# Patient Record
Sex: Male | Born: 1960 | Race: White | Hispanic: No | Marital: Married | Smoking: Never smoker
Health system: Southern US, Community
[De-identification: ages and names within clinical notes are randomized; demographics above are authoritative.]

## PROBLEM LIST (undated history)

## (undated) DIAGNOSIS — M1990 Unspecified osteoarthritis, unspecified site: Secondary | ICD-10-CM

## (undated) DIAGNOSIS — E291 Testicular hypofunction: Secondary | ICD-10-CM

## (undated) DIAGNOSIS — I10 Essential (primary) hypertension: Secondary | ICD-10-CM

## (undated) DIAGNOSIS — C20 Malignant neoplasm of rectum: Secondary | ICD-10-CM

## (undated) DIAGNOSIS — E785 Hyperlipidemia, unspecified: Secondary | ICD-10-CM

## (undated) DIAGNOSIS — R7303 Prediabetes: Secondary | ICD-10-CM

## (undated) HISTORY — DX: Essential (primary) hypertension: I10

## (undated) HISTORY — DX: Prediabetes: R73.03

## (undated) HISTORY — PX: HERNIA REPAIR: SHX51

## (undated) HISTORY — DX: Hyperlipidemia, unspecified: E78.5

## (undated) HISTORY — PX: COLOSTOMY: SHX63

## (undated) HISTORY — PX: WISDOM TOOTH EXTRACTION: SHX21

## (undated) HISTORY — PX: RECONSTRUCT / STABILIZE DISTAL ULNA: SUR1072

## (undated) HISTORY — DX: Testicular hypofunction: E29.1

## (undated) HISTORY — DX: Unspecified osteoarthritis, unspecified site: M19.90

## (undated) HISTORY — DX: Malignant neoplasm of rectum: C20

---

## 2004-10-04 ENCOUNTER — Ambulatory Visit: Payer: Self-pay | Admitting: Gastroenterology

## 2004-10-23 ENCOUNTER — Ambulatory Visit: Payer: Self-pay | Admitting: Gastroenterology

## 2007-09-02 DIAGNOSIS — C20 Malignant neoplasm of rectum: Secondary | ICD-10-CM

## 2007-09-02 HISTORY — PX: COLON SURGERY: SHX602

## 2007-09-02 HISTORY — DX: Malignant neoplasm of rectum: C20

## 2008-04-05 ENCOUNTER — Ambulatory Visit: Payer: Self-pay | Admitting: Gastroenterology

## 2008-04-15 ENCOUNTER — Encounter: Payer: Self-pay | Admitting: Gastroenterology

## 2008-04-17 ENCOUNTER — Ambulatory Visit: Payer: Self-pay | Admitting: Gastroenterology

## 2008-04-17 ENCOUNTER — Encounter: Payer: Self-pay | Admitting: Gastroenterology

## 2008-04-19 ENCOUNTER — Telehealth: Payer: Self-pay | Admitting: Gastroenterology

## 2008-04-20 ENCOUNTER — Ambulatory Visit: Payer: Self-pay | Admitting: Gastroenterology

## 2008-04-25 ENCOUNTER — Ambulatory Visit: Payer: Self-pay | Admitting: Cardiology

## 2008-04-25 ENCOUNTER — Telehealth: Payer: Self-pay | Admitting: Gastroenterology

## 2008-05-05 ENCOUNTER — Telehealth: Payer: Self-pay | Admitting: Gastroenterology

## 2008-05-09 ENCOUNTER — Encounter: Payer: Self-pay | Admitting: Gastroenterology

## 2008-05-12 ENCOUNTER — Ambulatory Visit: Payer: Self-pay | Admitting: Hematology and Oncology

## 2008-05-12 ENCOUNTER — Ambulatory Visit: Payer: Self-pay | Admitting: Gastroenterology

## 2008-05-12 ENCOUNTER — Ambulatory Visit (HOSPITAL_COMMUNITY): Admission: RE | Admit: 2008-05-12 | Discharge: 2008-05-12 | Payer: Self-pay | Admitting: Gastroenterology

## 2008-05-16 ENCOUNTER — Encounter: Payer: Self-pay | Admitting: Gastroenterology

## 2008-05-17 ENCOUNTER — Ambulatory Visit: Admission: RE | Admit: 2008-05-17 | Discharge: 2008-05-26 | Payer: Self-pay | Admitting: Radiation Oncology

## 2008-05-25 ENCOUNTER — Encounter: Payer: Self-pay | Admitting: Gastroenterology

## 2008-05-29 ENCOUNTER — Encounter: Payer: Self-pay | Admitting: Gastroenterology

## 2008-07-18 ENCOUNTER — Encounter: Payer: Self-pay | Admitting: Gastroenterology

## 2008-07-18 HISTORY — PX: ABDOMINOPERINEAL PROCTOCOLECTOMY: SUR8

## 2008-07-24 ENCOUNTER — Encounter: Payer: Self-pay | Admitting: Gastroenterology

## 2008-08-02 ENCOUNTER — Encounter: Payer: Self-pay | Admitting: Gastroenterology

## 2008-08-04 ENCOUNTER — Encounter: Payer: Self-pay | Admitting: Gastroenterology

## 2008-08-08 ENCOUNTER — Encounter: Payer: Self-pay | Admitting: Gastroenterology

## 2008-08-11 ENCOUNTER — Encounter: Payer: Self-pay | Admitting: Gastroenterology

## 2008-08-15 ENCOUNTER — Encounter: Payer: Self-pay | Admitting: Gastroenterology

## 2008-08-18 ENCOUNTER — Encounter: Payer: Self-pay | Admitting: Gastroenterology

## 2008-08-21 ENCOUNTER — Encounter: Payer: Self-pay | Admitting: Gastroenterology

## 2008-08-24 ENCOUNTER — Encounter: Payer: Self-pay | Admitting: Gastroenterology

## 2008-08-28 ENCOUNTER — Encounter: Payer: Self-pay | Admitting: Gastroenterology

## 2008-08-31 ENCOUNTER — Encounter: Payer: Self-pay | Admitting: Gastroenterology

## 2008-09-01 HISTORY — PX: PARASTOMAL HERNIA REPAIR: SHX2162

## 2008-09-12 ENCOUNTER — Encounter: Payer: Self-pay | Admitting: Gastroenterology

## 2008-09-28 ENCOUNTER — Encounter: Payer: Self-pay | Admitting: Gastroenterology

## 2008-12-28 IMAGING — CT CT ABDOMEN W/ CM
2 of 5 series · 17 of 46 positions shown, 19 images · IV contrast (Omnipaque 300)
Comparison: None

CT ABDOMEN

CLINICAL DATA: Rectal mass on colonoscopy.

CT ABDOMEN AND PELVIS WITH CONTRAST
TECHNIQUE: Multidetector CT imaging of the abdomen and pelvis was
performed using the standard protocol following bolus
administration of intravenous contrast.
Contrast: 125 ml Omniscan 300 IV contrast

[Series 2: abd_pel 5.0 b30f st · axial · 0.85mm/px · z∈[-576,-130]mm · 14 of 101 slices shown, 16 images]
[im 6/101  soft-tissue]
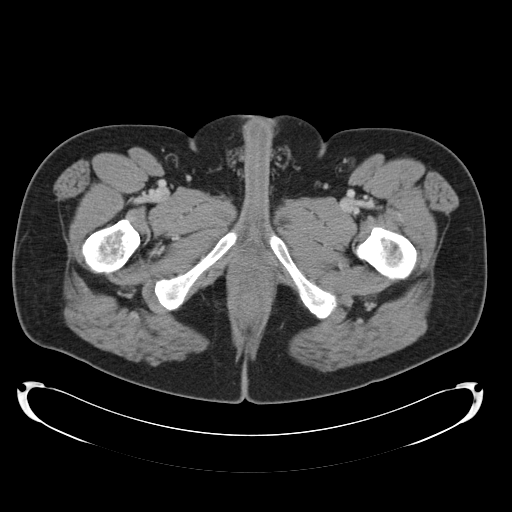
[im 6/101  bone]
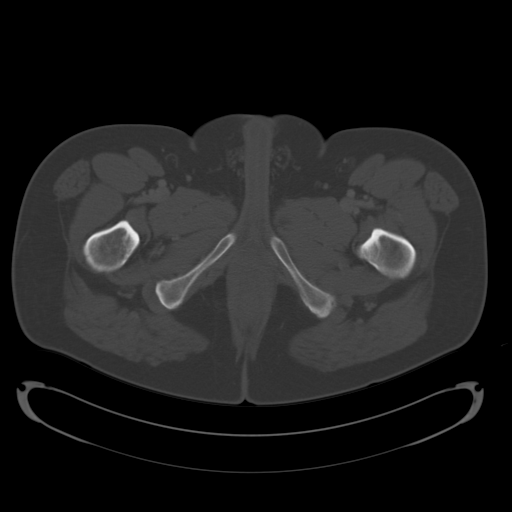
[im 11/101  soft-tissue]
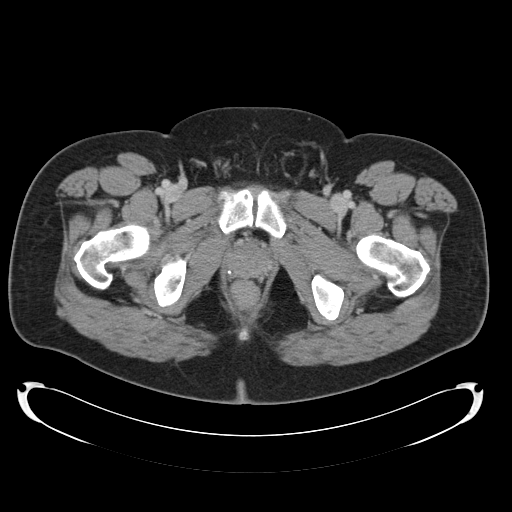
[im 22/101  soft-tissue]
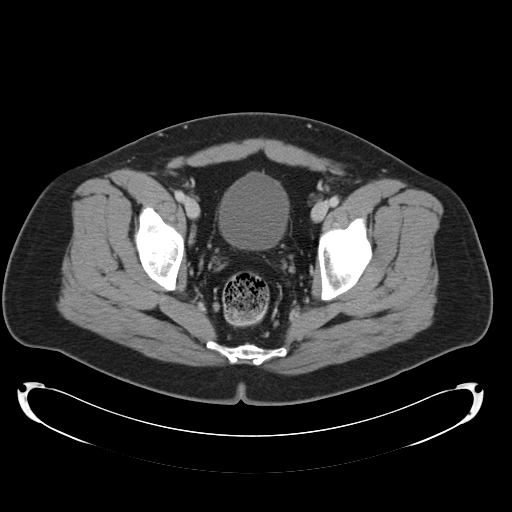
[im 27/101  soft-tissue]
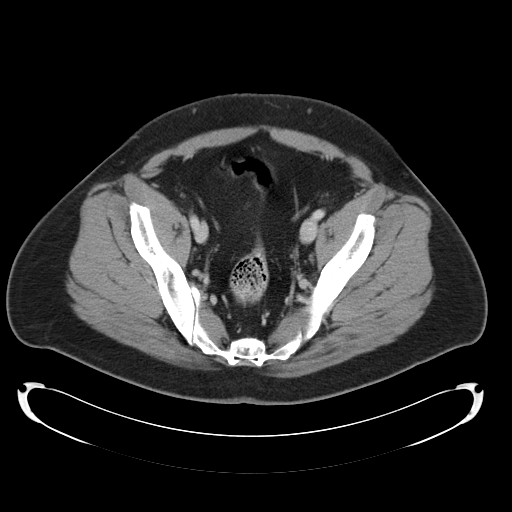
[im 32/101  soft-tissue]
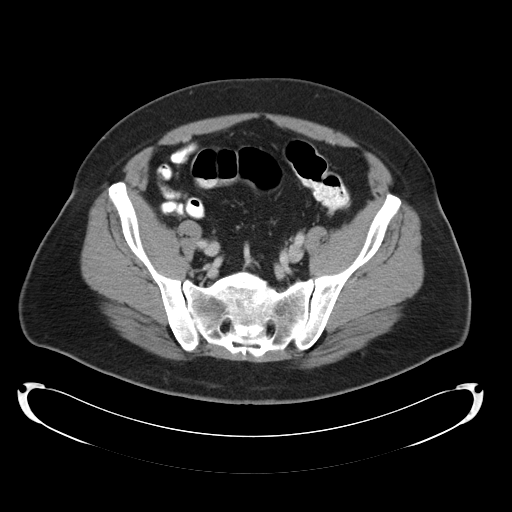
[im 43/101  soft-tissue]
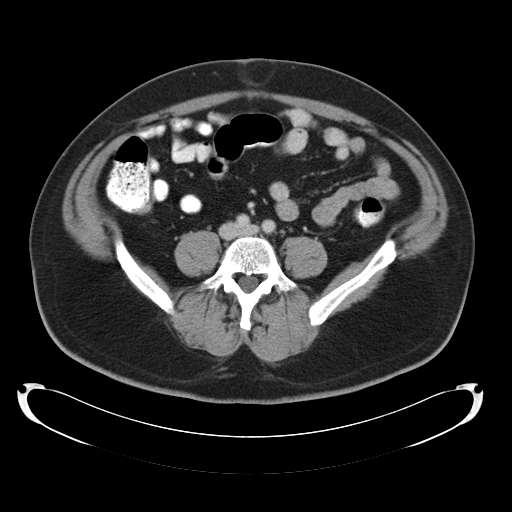
[im 48/101  soft-tissue]
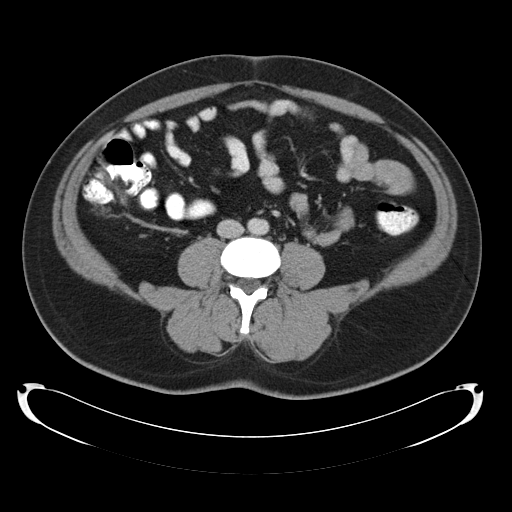
[im 53/101  soft-tissue]
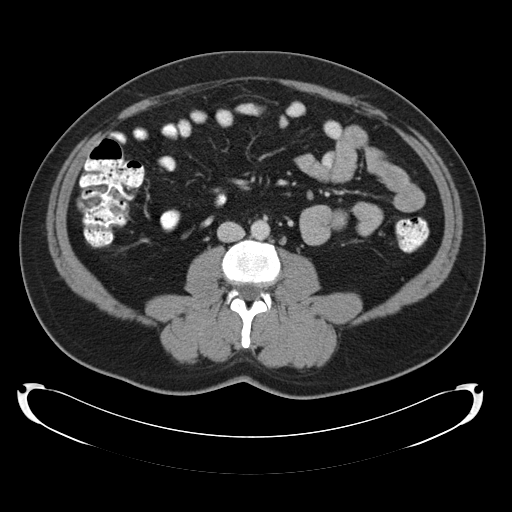
[im 58/101  soft-tissue]
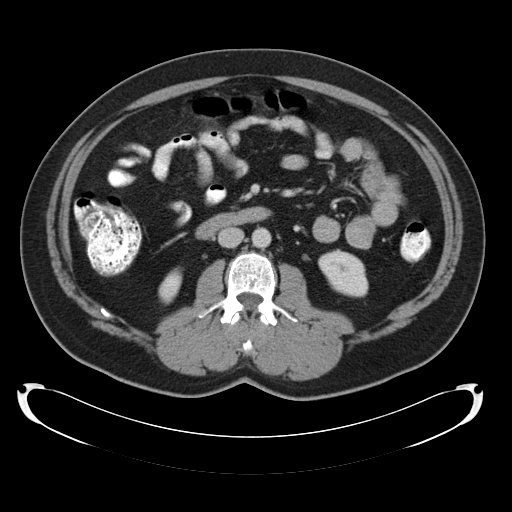
[im 58/101  bone]
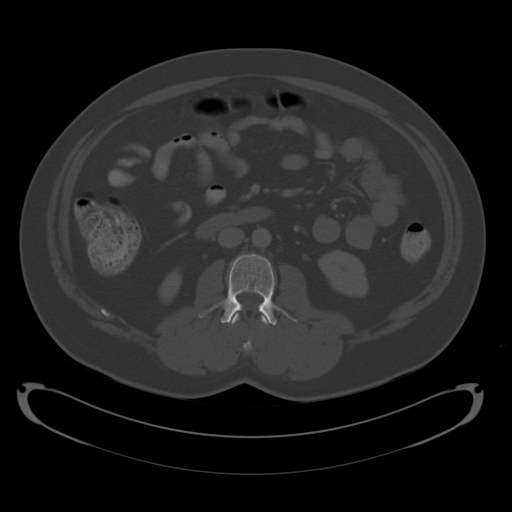
[im 69/101  soft-tissue]
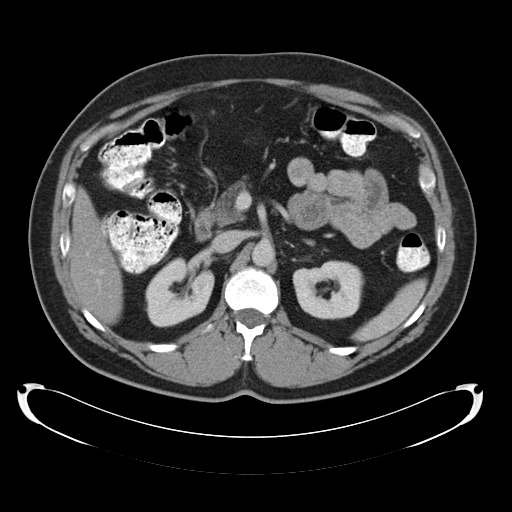
[im 74/101  soft-tissue]
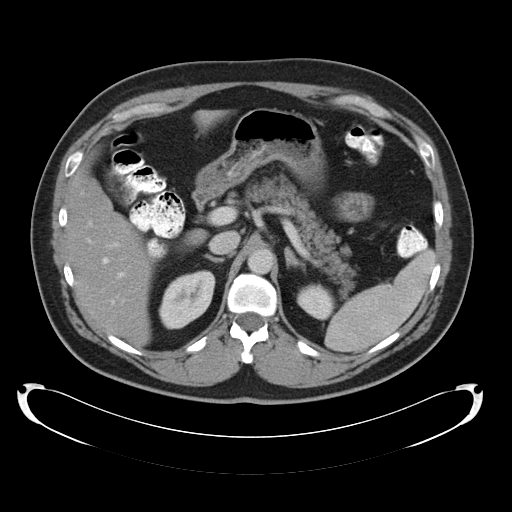
[im 79/101  soft-tissue]
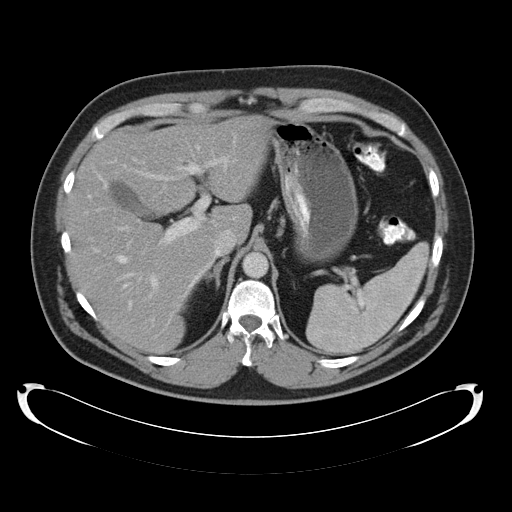
[im 90/101  soft-tissue]
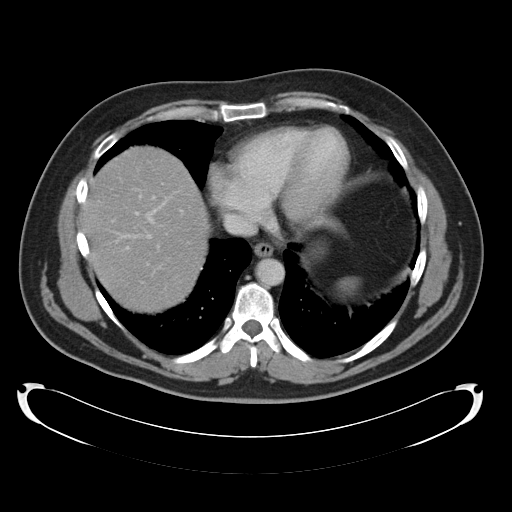
[im 95/101  soft-tissue]
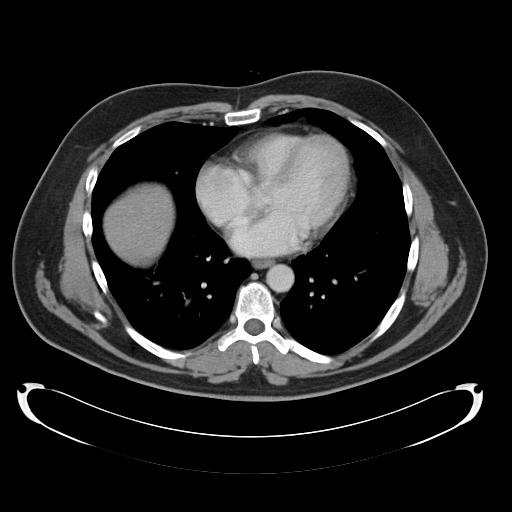

[Series 602: <mpr thick range> · coronal · 1.02mm/px · 3 of 99 slices shown]
[im 33/99  soft-tissue]
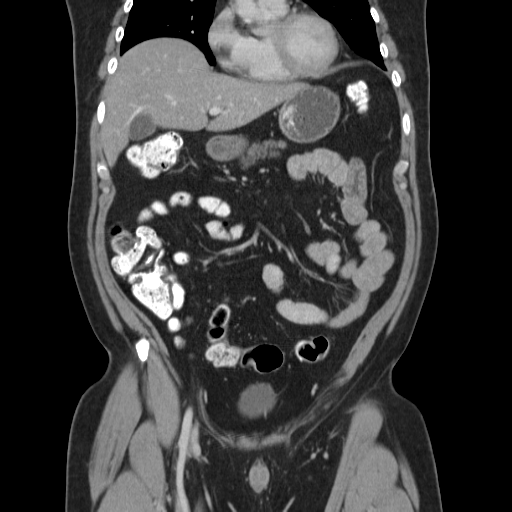
[im 44/99  soft-tissue]
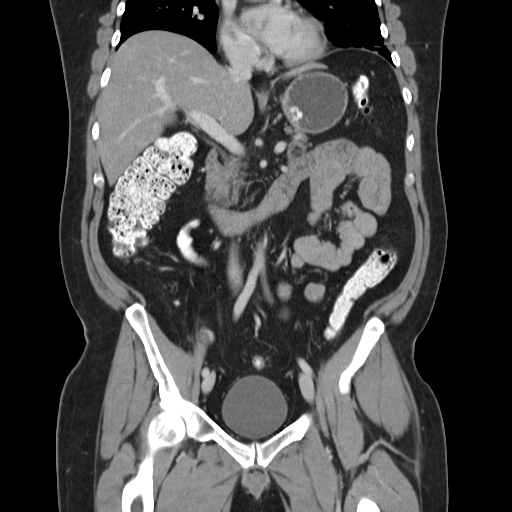
[im 55/99  soft-tissue]
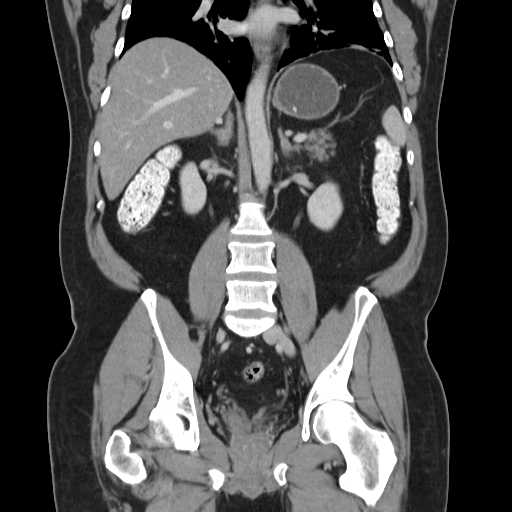

[17 of 46 positions shown; findings below may reference images not displayed]

FINDINGS: Plate-like bilateral lower lobe atelectasis or scarring
noted.  Fatty liver.  Abdominal viscera are otherwise unremarkable.
No lymphadenopathy or ascites.
IMPRESSION: No acute intra-abdominal finding.  No evidence for intra abdominal
metastatic disease.

CT PELVIS
FINDINGS: Fat containing umbilical hernia noted.  Appendix and
small bowel unremarkable.  There is focal concentric rectal wall
thickening measuring approximately 1 cm on image 85.  This segment
is not distended by oral contrast and it is difficult to
distinguish the colonic wall from colonic contents to obtain
precise thickness measurements.  The perirectal fat is clean and no
pelvic lymphadenopathy is seen.  No retroperitoneal
lymphadenopathy.  No acute bony finding or lytic/sclerotic osseous
lesion.
IMPRESSION: Focal rectal wall thickening which may correspond to the reported
neoplasm.  No evidence for intrapelvic metastasis.

## 2009-01-25 ENCOUNTER — Encounter: Payer: Self-pay | Admitting: Internal Medicine

## 2009-01-25 ENCOUNTER — Encounter: Payer: Self-pay | Admitting: Gastroenterology

## 2009-08-16 ENCOUNTER — Encounter: Payer: Self-pay | Admitting: Gastroenterology

## 2009-08-20 ENCOUNTER — Encounter: Payer: Self-pay | Admitting: Gastroenterology

## 2009-08-23 ENCOUNTER — Encounter: Payer: Self-pay | Admitting: Gastroenterology

## 2009-10-04 ENCOUNTER — Encounter: Payer: Self-pay | Admitting: Gastroenterology

## 2010-03-20 ENCOUNTER — Encounter (INDEPENDENT_AMBULATORY_CARE_PROVIDER_SITE_OTHER): Payer: Self-pay | Admitting: *Deleted

## 2010-07-10 DIAGNOSIS — I10 Essential (primary) hypertension: Secondary | ICD-10-CM | POA: Insufficient documentation

## 2010-07-10 DIAGNOSIS — E669 Obesity, unspecified: Secondary | ICD-10-CM | POA: Insufficient documentation

## 2010-07-10 DIAGNOSIS — K429 Umbilical hernia without obstruction or gangrene: Secondary | ICD-10-CM | POA: Insufficient documentation

## 2010-07-10 DIAGNOSIS — M109 Gout, unspecified: Secondary | ICD-10-CM | POA: Insufficient documentation

## 2010-07-12 ENCOUNTER — Encounter (INDEPENDENT_AMBULATORY_CARE_PROVIDER_SITE_OTHER): Payer: Self-pay | Admitting: *Deleted

## 2010-07-12 ENCOUNTER — Ambulatory Visit: Payer: Self-pay | Admitting: Gastroenterology

## 2010-07-18 ENCOUNTER — Ambulatory Visit: Payer: Self-pay | Admitting: Hematology and Oncology

## 2010-07-29 ENCOUNTER — Encounter: Payer: Self-pay | Admitting: Gastroenterology

## 2010-07-30 ENCOUNTER — Telehealth (INDEPENDENT_AMBULATORY_CARE_PROVIDER_SITE_OTHER): Payer: Self-pay | Admitting: *Deleted

## 2010-09-05 ENCOUNTER — Ambulatory Visit: Payer: Self-pay | Admitting: Hematology and Oncology

## 2010-09-16 ENCOUNTER — Ambulatory Visit
Admission: RE | Admit: 2010-09-16 | Discharge: 2010-09-16 | Payer: Self-pay | Source: Home / Self Care | Attending: Gastroenterology | Admitting: Gastroenterology

## 2010-09-16 ENCOUNTER — Encounter: Payer: Self-pay | Admitting: Gastroenterology

## 2010-10-01 NOTE — Letter (Signed)
Summary: Colonoscopy-Changed to Office Visit Letter  [facility]  [address] [place], [place] [address]   Phone: [phone]  Fax: [phone]      March 20, 2010 MRN: [id number]   Juan Higgins [address] [place], [place]  [address]   Dear Mr. Lehigh,   According to our records, it is time for you to schedule a Colonoscopy. However, after reviewing your medical record, I feel that an office visit would be most appropriate to more completely evaluate you and determine your need for a repeat procedure.  Please call [phone] (option #2) at your convenience to schedule an office visit. If you have any questions, concerns, or feel that this letter is in error, we would appreciate your call.   Sincerely,    Vania Rea. Jarold Motto, M.D.  [facility] [phone]

## 2010-10-01 NOTE — Letter (Signed)
Summary: [facility]  [facility]   Imported By: Lennie Odor 10/08/2009 14:24:22  _____________________________________________________________________  External Attachment:    Type:   Image     Comment:   External Document

## 2010-10-01 NOTE — Op Note (Signed)
Summary: [facility]  [facility]   Imported By: Lester [place] 07/22/2010 10:47:57  _____________________________________________________________________  External Attachment:    Type:   Image     Comment:   External Document

## 2010-10-01 NOTE — Letter (Signed)
Summary: [facility]  [facility]   Imported By: Lester [place] 07/22/2010 10:37:44  _____________________________________________________________________  External Attachment:    Type:   Image     Comment:   External Document

## 2010-10-01 NOTE — Op Note (Signed)
Summary: [facility]  [facility]   Imported By: Lester [place] 07/22/2010 10:43:46  _____________________________________________________________________  External Attachment:    Type:   Image     Comment:   External Document

## 2010-10-01 NOTE — Assessment & Plan Note (Signed)
Summary: ov before col...as.   History of Present Illness Visit Type: Follow-up Visit Primary GI MD: Sheryn Bison MD FACP FAGA Primary Shuntavia Yerby: Lucky Cowboy, MD Requesting Elsey Holts: NA Chief Complaint: Hx of colon cancer, patient had colostomy 2 years ago, sugeon requested recheck History of Present Illness:   50 year old white male status post abdominal perineal resection at [facility] in 2009 per localized adenocarcinoma of the rectosigmoid area. He had previous endoscopic removal of a large villous adenoma in 2006. 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 He currently is asymptomatic and is irrigating his colostomy daily. He has some vague occasional lower abdominal discomfort but otherwise denies gastrointestinal or general medical symptomatology. His previous surgeries by Dr. Durenda Hurt.   GI Review of Systems      Denies abdominal pain, acid reflux, belching, bloating, chest pain, dysphagia with liquids, dysphagia with solids, heartburn, loss of appetite, nausea, vomiting, vomiting blood, weight loss, and  weight gain.        Denies anal fissure, black tarry stools, change in bowel habit, constipation, diarrhea, diverticulosis, fecal incontinence, heme positive stool, hemorrhoids, irritable bowel syndrome, jaundice, light color stool, liver problems, rectal bleeding, and  rectal pain.    Current Medications (verified): 1)  None  Allergies (verified): No Known Drug Allergies  Past History:  Past medical, surgical, family and social histories (including risk factors) reviewed for relevance to current acute and chronic problems.  Past Medical History: Reviewed history from 07/10/2010 and no changes required. 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 No FH of Colon Cancer: Family History of Diabetes: paternal grandmother Family History of Heart Disease: mother  Lymphoma-sister  Social History: Reviewed history from 04/20/2008 and no changes required. Patient has never smoked.  Alcohol Use - no Daily Caffeine Use Occupation: Sales  Review of Systems  The patient denies allergy/sinus, anemia, anxiety-new, arthritis/joint pain, back pain, blood in urine, breast changes/lumps, change in vision, confusion, cough, coughing up blood, depression-new, fainting, fatigue, fever, headaches-new, hearing problems, heart murmur, heart rhythm changes, itching, menstrual pain, muscle pains/cramps, night sweats, nosebleeds, pregnancy symptoms, shortness of breath, skin rash, sleeping problems, sore throat, swelling of feet/legs, swollen lymph glands, thirst - excessive , urination - excessive , urination changes/pain, urine leakage, vision changes, and voice change.    Vital Signs:  Patient profile:   50 year old male Height:      71 inches Weight:      214.13 pounds BMI:     29.97 Pulse rate:   100 / minute Pulse rhythm:   regular BP sitting:   120 / 78  (left arm) Cuff size:   regular  Vitals Entered By: June McMurray CMA Duncan Dull) (July 12, 2010 8:52 AM)  Physical Exam  General:  Well developed, well nourished, no acute distress.healthy appearing.   Head:  Normocephalic and atraumatic. Eyes:  PERRLA, no icterus.exam deferred to patient's ophthalmologist.   Lungs:  Clear throughout to auscultation. Heart:  Regular rate and rhythm; no murmurs, rubs,  or bruits. Abdomen:  well-healed colostomy in left mid abdominal area. Abdominal exam is otherwise unremarkable. Bowel sounds are normal. Genitalia:  GU exam deferred to patient's Urologist.   Msk:  Symmetrical  with no gross deformities. Normal posture.  Extremities:  No clubbing, cyanosis, edema or deformities noted. Neurologic:  Alert and  oriented x4;  grossly normal neurologically. Psych:  Alert and cooperative. Normal mood and affect.   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 I also have referred him to our geneticist for evaluation of right irritable colon cancer syndromes. He does have 3 male children. Previous rectal carcinoma was T2 N0 lesion.  Orders: Colonoscopy (Colon)  Problem # 2:  HYPERTENSION (ICD-401.9) Assessment: Improved blood pressure today 120/76.He has appointment to see Dr. Oneta Rack  next week and we will request a copy of his yearly blood work for our records.  Other Orders: [facility])  Patient Instructions: 1)  Copy sent to : Dr. Lucky Cowboy 2)  Please continue current medications.  3)  Colonoscopy and Flexible Sigmoidoscopy brochure given.  4)  [facility] Patient Information Guide given to patient.  5)  Your procedure has been scheduled for 08/02/2010, please follow the seperate instructions.  6)  Your prescription(s) have been sent to you pharmacy.  7)  We will request your labs from Dr. Oneta Rack. 8)  We are referring you to the [facility], either our office or their office will contact you about an appt.  9)  The medication list was reviewed and reconciled.  All changed / newly prescribed medications were explained.  A complete medication list was provided to the patient / caregiver. Prescriptions: MOVIPREP 100 GM  SOLR (PEG-KCL-NACL-NASULF-NA ASC-C) As per prep instructions.  #1 x 0   Entered by:   Harlow Mares CMA (AAMA)   Authorized by:   Mardella Layman MD [facility]   Signed by:   Harlow Mares CMA (AAMA) on 07/12/2010   Method used:   Electronically to        [facility] [address] (retail)       [address]       [place], [place]  [address]       Ph: [phone]       Fax: [phone]   RxID:   [id number]

## 2010-10-01 NOTE — Op Note (Signed)
Summary: [facility]  Rect[facility]   Imported By: Lester [place] 07/22/2010 10:26:46  _____________________________________________________________________  External Attachment:    Type:   Image     Comment:   External Document

## 2010-10-01 NOTE — Op Note (Signed)
Summary: [facility]  [facility]   Imported By: Lester [place] 07/22/2010 10:41:43  _____________________________________________________________________  External Attachment:    Type:   Image     Comment:   External Document

## 2010-10-01 NOTE — Op Note (Signed)
Summary: Wound Drainage/[facility]   Wound Drainage/[facility]   Imported By: Lester [place] 07/22/2010 10:52:54  _____________________________________________________________________  External Attachment:    Type:   Image     Comment:   External Document

## 2010-10-01 NOTE — Op Note (Signed)
Summary: [facility]  [facility]   Imported By: Lester [place] 07/22/2010 10:49:09  _____________________________________________________________________  External Attachment:    Type:   Image     Comment:   External Document

## 2010-10-01 NOTE — Progress Notes (Signed)
  Phone Note Other Incoming   Request: Send information Summary of Call: Records received from [facility] (5 Pages) forwarded to Dr. Sheryn Bison.

## 2010-10-01 NOTE — Op Note (Signed)
Summary: [facility]  [facility]   Imported By: Lester [place] 07/22/2010 10:42:44  _____________________________________________________________________  External Attachment:    Type:   Image     Comment:   External Document

## 2010-10-01 NOTE — Letter (Signed)
Summary: [facility] Instructions  [facility]  [address] [place], [place] [address]   Phone: [phone]  Fax: [phone]       GAVIN TELFORD    13-Apr-1961    MRN: [id number]        Procedure Day /Date: 08/02/2010 Friday     Arrival Time: 2:00pm     Procedure Time: 3:00pm     Location of Procedure:                    X  [facility] (4th Floor)                        PREPARATION FOR COLONOSCOPY WITH MOVIPREP   Starting 5 days prior to your procedure 07/28/2010 do not eat nuts, seeds, popcorn, corn, beans, peas,  salads, or any raw vegetables.  Do not take any fiber supplements (e.g. Metamucil, Citrucel, and Benefiber).  THE DAY BEFORE YOUR PROCEDURE         DATE: 08/01/2010   DAY: Thursday  1.  Drink clear liquids the entire day-NO SOLID FOOD  2.  Do not drink anything colored red or purple.  Avoid juices with pulp.  No orange juice.  3.  Drink at least 64 oz. (8 glasses) of fluid/clear liquids during the day to prevent dehydration and help the prep work efficiently.  CLEAR LIQUIDS INCLUDE: Water Jello Ice Popsicles Tea (sugar ok, no milk/cream) Powdered fruit flavored drinks Coffee (sugar ok, no milk/cream) Gatorade Juice: apple, white grape, white cranberry  Lemonade Clear bullion, consomm, broth Carbonated beverages (any kind) Strained chicken noodle soup Hard Candy                             4.  In the morning, mix first dose of MoviPrep solution:    Empty 1 Pouch A and 1 Pouch B into the disposable container    Add lukewarm drinking water to the top line of the container. Mix to dissolve    Refrigerate (mixed solution should be used within 24 hrs)  5.  Begin drinking the prep at 5:00 p.m. The MoviPrep container is divided by 4 marks.   Every 15 minutes drink the solution down to the next mark (approximately 8 oz) until the full liter is complete.   6.  Follow completed prep with 16 oz of clear liquid of your choice (Nothing  red or purple).  Continue to drink clear liquids until bedtime.  7.  Before going to bed, mix second dose of MoviPrep solution:    Empty 1 Pouch A and 1 Pouch B into the disposable container    Add lukewarm drinking water to the top line of the container. Mix to dissolve    Refrigerate  THE DAY OF YOUR PROCEDURE      DATE: 08/02/2010   DAY: Friday  Beginning at 10:00am (5 hours before procedure):         1. Every 15 minutes, drink the solution down to the next mark (approx 8 oz) until the full liter is complete.  2. Follow completed prep with 16 oz. of clear liquid of your choice.    3. You may drink clear liquids until 1:00pm (2 HOURS BEFORE PROCEDURE).   MEDICATION INSTRUCTIONS  Unless otherwise instructed, you should take regular prescription medications with a small sip of water   as early as possible the morning of your procedure.  OTHER INSTRUCTIONS  You will need a responsible adult at least 52 years of age to accompany you and drive you home.   This person must remain in the waiting room during your procedure.  Wear loose fitting clothing that is easily removed.  Leave jewelry and other valuables at home.  However, you may wish to bring a book to read or  an iPod/MP3 player to listen to music as you wait for your procedure to start.  Remove all body piercing jewelry and leave at home.  Total time from sign-in until discharge is approximately 2-3 hours.  You should go home directly after your procedure and rest.  You can resume normal activities the  day after your procedure.  The day of your procedure you should not:   Drive   Make legal decisions   Operate machinery   Drink alcohol   Return to work  You will receive specific instructions about eating, activities and medications before you leave.    The above instructions have been reviewed and explained to me by   _______________________    I fully understand and can verbalize these  instructions _____________________________ Date _________

## 2010-10-01 NOTE — Op Note (Signed)
Summary: [facility]  Wound washing/[facility]   Imported By: Lester [place] 07/22/2010 10:51:08  _____________________________________________________________________  External Attachment:    Type:   Image     Comment:   External Document

## 2010-10-01 NOTE — Op Note (Signed)
Summary: [facility]  [facility]   Imported By: Lester [place] 07/22/2010 10:45:32  _____________________________________________________________________  External Attachment:    Type:   Image     Comment:   External Document

## 2010-10-01 NOTE — Letter (Signed)
Summary: Oncology Surgical/[facility]  Oncology Surgical/[facility]   Imported By: Lester [place] 07/22/2010 10:40:04  _____________________________________________________________________  External Attachment:    Type:   Image     Comment:   External Document

## 2010-10-01 NOTE — Op Note (Signed)
Summary: [facility]  [facility]   Imported By: Lester [place] 07/22/2010 10:28:08  _____________________________________________________________________  External Attachment:    Type:   Image     Comment:   External Document

## 2010-10-01 NOTE — Op Note (Signed)
Summary: Parastomal hernia/[facility]  Parastomal hernia/[facility]   Imported By: Lester [place] 07/22/2010 10:35:52  _____________________________________________________________________  External Attachment:    Type:   Image     Comment:   External Document

## 2010-10-01 NOTE — Op Note (Signed)
Summary: [facility]  [facility]   Imported By: Lester [place] 07/22/2010 10:46:56  _____________________________________________________________________  External Attachment:    Type:   Image     Comment:   External Document

## 2010-10-03 NOTE — Procedures (Signed)
Summary: Colonoscopy  Patient: Juan Higgins Note: All result statuses are Final unless otherwise noted.  Tests: (1) Colonoscopy (COL)   COL Colonoscopy           DONE     [facility]     [address]     [place], [place]  [address]           COLONOSCOPY PROCEDURE REPORT           PATIENT:  [name], [name]  MR#:  [id number]     BIRTHDATE:  Mar 01, 1961, 49 yrs. old  GENDER:  male     ENDOSCOPIST:  Vania Rea. Jarold Motto, MD, [facility]     REF. BY:  Durenda Hurt, M.D.     Lucky Cowboy, M.D.     PROCEDURE DATE:  09/16/2010     PROCEDURE:  Diagnostic Colonoscopy     ASA CLASS:  Class II     INDICATIONS:  APESECTION FOR T2N0M0 RECTAL CA IN 2009.RECENT     PARASTOMAL HERNIA REVISION.     MEDICATIONS:   Fentanyl 75 mcg IV, Versed 8 mg IV           DESCRIPTION OF PROCEDURE:   After the risks benefits and     alternatives of the procedure were thoroughly explained, informed     consent was obtained.  STOMAL EXAM NORMAL. The LB 180AL [id number]     endoscope was introduced through the anus and advanced to the     cecum, which was identified by both the appendix and ileocecal     valve, without limitations.  The quality of the prep was     excellent, using MoviPrep.  The instrument was then slowly     withdrawn as the colon was fully examined.     <<PROCEDUREIMAGES>>           FINDINGS:  No polyps or cancers were seen.  This was otherwise a     normal examination of the colon. INTUBATION OF COLOSTOMY     SITE.RECTUM SURGICALLY ABSENT,,,   Retroflexion was not performed.     The scope was then withdrawn from the patient and the procedure     completed.           COMPLICATIONS:  None     ENDOSCOPIC IMPRESSION:     1) No polyps or cancers     2) Otherwise normal examination     PRIOR AP RESECTION FOR RECTAL CARCINIMA.     RECOMMENDATIONS:     1) Repeat Colonoscopy in 3 years.     REPEAT EXAM:  No           ______________________________     Vania Rea. Jarold Motto, MD, [facility]        CC:  Durenda Hurt, M.D.     Lucky Cowboy, M.D.           n.     eSIGNED:   Vania Rea. Emanuela Runnion at 09/16/2010 10:51 AM           Geryl Councilman, [phone]  Note: An exclamation mark (!) indicates a result that was not dispersed into the flowsheet. Document Creation Date: 09/16/2010 10:52 AM _______________________________________________________________________  (1) Order result status: Final Collection or observation date-time: 09/16/2010 10:44 Requested date-time:  Receipt date-time:  Reported date-time:  Referring Physician:   Ordering Physician: Sheryn Bison [phone]) Specimen Source:  Source: Launa Grill Order Number: [id number] Lab site:   Appended Document: Colonoscopy    Clinical Lists Changes  Observations:  Added new observation of COLONNXTDUE: 09/2013 (09/16/2010 13:05)

## 2010-10-10 ENCOUNTER — Encounter: Payer: Self-pay | Admitting: Gastroenterology

## 2010-10-23 NOTE — Letter (Signed)
Summary: [facility]  [facility]   Imported By: Lennie Odor 10/17/2010 13:54:46  _____________________________________________________________________  External Attachment:    Type:   Image     Comment:   External Document

## 2011-09-02 HISTORY — PX: OTHER SURGICAL HISTORY: SHX169

## 2013-07-25 ENCOUNTER — Other Ambulatory Visit: Payer: Self-pay | Admitting: Physician Assistant

## 2013-07-25 MED ORDER — TESTOSTERONE 40.5 MG/2.5GM (1.62%) TD GEL: TRANSDERMAL | Status: DC

## 2013-09-07 ENCOUNTER — Encounter: Payer: Self-pay | Admitting: Internal Medicine

## 2013-09-09 ENCOUNTER — Encounter: Payer: Self-pay | Admitting: Internal Medicine

## 2013-09-09 ENCOUNTER — Other Ambulatory Visit: Payer: Self-pay | Admitting: Internal Medicine

## 2013-09-09 ENCOUNTER — Ambulatory Visit (INDEPENDENT_AMBULATORY_CARE_PROVIDER_SITE_OTHER): Payer: 59 | Admitting: Internal Medicine

## 2013-09-09 VITALS — BP 124/88 | HR 76 | Temp 98.1°F | Resp 18 | Ht 68.25 in | Wt 220.2 lb

## 2013-09-09 DIAGNOSIS — Z113 Encounter for screening for infections with a predominantly sexual mode of transmission: Secondary | ICD-10-CM

## 2013-09-09 DIAGNOSIS — R740 Nonspecific elevation of levels of transaminase and lactic acid dehydrogenase [LDH]: Secondary | ICD-10-CM

## 2013-09-09 DIAGNOSIS — Z1212 Encounter for screening for malignant neoplasm of rectum: Secondary | ICD-10-CM

## 2013-09-09 DIAGNOSIS — R7401 Elevation of levels of liver transaminase levels: Secondary | ICD-10-CM

## 2013-09-09 DIAGNOSIS — Z0000 Encounter for general adult medical examination without abnormal findings: Secondary | ICD-10-CM

## 2013-09-09 DIAGNOSIS — R7402 Elevation of levels of lactic acid dehydrogenase (LDH): Secondary | ICD-10-CM

## 2013-09-09 DIAGNOSIS — Z125 Encounter for screening for malignant neoplasm of prostate: Secondary | ICD-10-CM

## 2013-09-09 DIAGNOSIS — Z111 Encounter for screening for respiratory tuberculosis: Secondary | ICD-10-CM

## 2013-09-09 DIAGNOSIS — E291 Testicular hypofunction: Secondary | ICD-10-CM

## 2013-09-09 DIAGNOSIS — I10 Essential (primary) hypertension: Secondary | ICD-10-CM

## 2013-09-09 DIAGNOSIS — E559 Vitamin D deficiency, unspecified: Secondary | ICD-10-CM

## 2013-09-09 DIAGNOSIS — E349 Endocrine disorder, unspecified: Secondary | ICD-10-CM | POA: Insufficient documentation

## 2013-09-09 LAB — CBC WITH DIFFERENTIAL/PLATELET
Basophils Absolute: 0 10*3/uL (ref 0.0–0.1)
Basophils Relative: 0 % (ref 0–1)
EOS ABS: 0.1 10*3/uL (ref 0.0–0.7)
Eosinophils Relative: 2 % (ref 0–5)
HCT: 44.9 % (ref 39.0–52.0)
HEMOGLOBIN: 15.8 g/dL (ref 13.0–17.0)
LYMPHS PCT: 22 % (ref 12–46)
Lymphs Abs: 1.6 10*3/uL (ref 0.7–4.0)
MCH: 28.7 pg (ref 26.0–34.0)
MCHC: 35.2 g/dL (ref 30.0–36.0)
MCV: 81.5 fL (ref 78.0–100.0)
MONOS PCT: 6 % (ref 3–12)
Monocytes Absolute: 0.5 10*3/uL (ref 0.1–1.0)
NEUTROS PCT: 70 % (ref 43–77)
Neutro Abs: 5 10*3/uL (ref 1.7–7.7)
Platelets: 215 10*3/uL (ref 150–400)
RBC: 5.51 MIL/uL (ref 4.22–5.81)
RDW: 14.6 % (ref 11.5–15.5)
WBC: 7.2 10*3/uL (ref 4.0–10.5)

## 2013-09-09 LAB — HEMOGLOBIN A1C
Hgb A1c MFr Bld: 5.9 % — ABNORMAL HIGH (ref <5.7)
MEAN PLASMA GLUCOSE: 123 mg/dL — AB (ref <117)

## 2013-09-09 NOTE — Progress Notes (Signed)
Patient ID: Juan Higgins, male   DOB: Mar 15, 1961, 53 y.o.   MRN: [id number]  Annual Screening Comprehensive Examination  This very nice 53 y.o.  MWM presents for complete physical.  Patient has been followed for HTN, Diabetes  Prediabetes, Hyperlipidemia, and Vitamin D Deficiency.   HTN predates since Nov 2012. Patient's reports BP has been controlled at home.Today's  BP: 124/88 mmHg. Patient denies any cardiac symptoms as chest pain, palpitations, shortness of breath, dizziness or ankle swelling.   Patient's hyperlipidemia is controlled with diet and medications. Patient denies myalgias or other medication SE's. Last cholesterol last visit was 122, triglycerides 206, HDL 44 and LDL 122 in Dec 2013.     Patient has prediabetes diagnosed in Nov 2012 with A1c 5.7 % and last A1c 5.5 % in Dec 2013. Patient denies reactive hypoglycemic symptoms, visual blurring, diabetic polys, or paresthesias.    Patient has history of Rectal Cancer Dx'd in Sept 2009 and underwent AP resection with colostomy creation. He has now been released after 5 year  Remission free F/U and to continue close periodic surveillance at [facility].   Finally, patient has history of Vitamin D Deficiency of 14 in 2009 with last vitamin D of 61 in Dec 2014. He also has  testosterone deficiency (testosterone 260 in 2012) for which he is on treatment with improved sense of well being and energy.     Medication Sig Dispense Refill  . aspirin 81 MG tablet Take 81 mg by mouth daily.      . Cholecalciferol (VITAMIN D-3) 1000 UNITS CAPS Take 8,000 Units by mouth daily.      . Testosterone (ANDROGEL) 40.5 MG/2.5GM (1.62%) GEL Apply 2 pumps daily  2.5 g  3     No Known Allergies  Past Medical History  Diagnosis Date  . Hyperlipidemia   . Hypertension   . Pre-diabetes   . Hypogonadism male     Past Surgical History  Procedure Laterality Date  . Hernia repair     2009 Colorectal AP Repair w/ Colostomy Creation         .  Reconstruct / stabilize distal ulna      Family History  Problem Relation Age of Onset  . COPD Mother   . Alcohol abuse Father   . Cirrhosis Father   . COPD Sister   . Alcohol abuse Brother     History   Social History  . Marital Status: Married    Spouse Name: N/A   Occupational History  . Not on file.   Social History Main Topics  . Smoking status: Never Smoker   . Smokeless tobacco: Not on file  . Alcohol Use: Yes     Comment: occasional  . Drug Use: No  . Sexual Activity: Not on file      ROS Constitutional: Denies fever, chills, weight loss/gain, headaches, insomnia, fatigue, night sweats, and change in appetite. Eyes: Denies redness, blurred vision, diplopia, discharge, itchy, watery eyes.  ENT: Denies discharge, congestion, post nasal drip, epistaxis, sore throat, earache, hearing loss, dental pain, Tinnitus, Vertigo, Sinus pain, snoring.  Cardio: Denies chest pain, palpitations, irregular heartbeat, syncope, dyspnea, diaphoresis, orthopnea, PND, claudication, edema Respiratory: denies cough, dyspnea, DOE, pleurisy, hoarseness, laryngitis, wheezing.  Gastrointestinal: Denies dysphagia, heartburn, reflux, water brash, pain, cramps, nausea, vomiting, bloating, diarrhea, constipation, hematemesis, melena, hematochezia, jaundice, hemorrhoids Genitourinary: Denies dysuria, frequency, urgency, nocturia, hesitancy, discharge, hematuria, flank pain Musculoskeletal: Denies arthralgia, myalgia, stiffness, Jt. Swelling, pain, limp, and strain/sprain. Skin: Denies puritis, rash,  hives, warts, acne, eczema, changing in skin lesion Neuro: No weakness, tremor, incoordination, spasms, paresthesia, pain Psychiatric: Denies confusion, memory loss, sensory loss Endocrine: Denies change in weight, skin, hair change, nocturia, and paresthesia, diabetic polys, visual blurring, hyper / hypo glycemic episodes.  Heme/Lymph: No excessive bleeding, bruising, or elarged lymph nodes.  BP:  124/88  Pulse: 76  Temp: 98.1 F (36.7 C)  Resp: 18      BMI         33.22 kg/(m^2)   Height     5' 8.25"    Weight    220 lb 3.2 oz   Physical Exam General Appearance: Well nourished, in no apparent distress. Eyes: PERRLA, EOMs, conjunctiva no swelling or erythema, normal fundi and vessels. Sinuses: No frontal/maxillary tenderness ENT/Mouth: EACs patent / TMs  nl. Nares clear without erythema, swelling, mucoid exudates. Oral hygiene is good. No erythema, swelling, or exudate. Tongue normal, non-obstructing. Tonsils not swollen or erythematous. Hearing normal.  Neck: Supple, thyroid normal. No bruits, nodes or JVD. Respiratory: Respiratory effort normal.  BS equal and clear bilateral without rales, rhonci, wheezing or stridor. Cardio: Heart sounds are normal with regular rate and rhythm and no murmurs, rubs or gallops. Peripheral pulses are normal and equal bilaterally without edema. No aortic or femoral bruits. Chest: symmetric with normal excursions and percussion.  Abdomen: Flat, soft, with bowl sounds. Functioning LLL colostomy. Nontender, no guarding, rebound, hernias, masses, or organomegaly.  Lymphatics: Non tender without lymphadenopathy.  Genitourinary: Deferred post AP ressection. Musculoskeletal: Full ROM all peripheral extremities, joint stability, 5/5 strength, and normal gait. Skin: Warm and dry without rashes, lesions, cyanosis, clubbing or  ecchymosis.  Neuro: Cranial nerves intact, reflexes equal bilaterally. Normal muscle tone, no cerebellar symptoms. Sensation intact.  Pysch: Awake and oriented X 3, normal affect, insight and judgment appropriate.   Assessment and Plan  1. Annual Screening Examination 2. Hypertension  3. Hyperlipidemia 4. Pre Diabetes 5. Vitamin D Deficiency 6. Obesity 7. Hx/o Rectal Cancer(2009) 8. Testosterone Deficiency  Continue prudent diet as discussed, weight control, BP monitoring, regular exercise, and medications as discussed.   Discussed med effects and SE's. Routine screening labs and tests as requested with regular follow-up as recommended.

## 2013-09-09 NOTE — Patient Instructions (Signed)

## 2013-09-10 LAB — BASIC METABOLIC PANEL WITH GFR
BUN: 14 mg/dL (ref 6–23)
CALCIUM: 9.9 mg/dL (ref 8.4–10.5)
CO2: 28 mEq/L (ref 19–32)
Chloride: 103 mEq/L (ref 96–112)
Creat: 0.93 mg/dL (ref 0.50–1.35)
Glucose, Bld: 85 mg/dL (ref 70–99)
POTASSIUM: 4.1 meq/L (ref 3.5–5.3)
SODIUM: 140 meq/L (ref 135–145)

## 2013-09-10 LAB — LIPID PANEL
Cholesterol: 208 mg/dL — ABNORMAL HIGH (ref 0–200)
HDL: 46 mg/dL (ref >39)
LDL CALC: 125 mg/dL — AB (ref 0–99)
TRIGLYCERIDES: 183 mg/dL — AB (ref <150)
Total CHOL/HDL Ratio: 4.5 Ratio
VLDL: 37 mg/dL (ref 0–40)

## 2013-09-10 LAB — HEPATIC FUNCTION PANEL
ALT: 38 U/L (ref 0–53)
AST: 22 U/L (ref 0–37)
Albumin: 4.5 g/dL (ref 3.5–5.2)
Alkaline Phosphatase: 49 U/L (ref 39–117)
BILIRUBIN DIRECT: 0.1 mg/dL (ref 0.0–0.3)
Indirect Bilirubin: 0.7 mg/dL (ref 0.0–0.9)
TOTAL PROTEIN: 7 g/dL (ref 6.0–8.3)
Total Bilirubin: 0.8 mg/dL (ref 0.3–1.2)

## 2013-09-10 LAB — VITAMIN D 25 HYDROXY (VIT D DEFICIENCY, FRACTURES): Vit D, 25-Hydroxy: 75 ng/mL (ref 30–89)

## 2013-09-10 LAB — VITAMIN B12: Vitamin B-12: 258 pg/mL (ref 211–911)

## 2013-09-10 LAB — MAGNESIUM: Magnesium: 2.1 mg/dL (ref 1.5–2.5)

## 2013-09-10 LAB — HEPATITIS C ANTIBODY: HCV Ab: NEGATIVE

## 2013-09-10 LAB — URINALYSIS, MICROSCOPIC ONLY
Bacteria, UA: NONE SEEN
Casts: NONE SEEN
Crystals: NONE SEEN
Squamous Epithelial / LPF: NONE SEEN

## 2013-09-10 LAB — INSULIN, FASTING: Insulin fasting, serum: 37 u[IU]/mL — ABNORMAL HIGH (ref 3–28)

## 2013-09-10 LAB — MICROALBUMIN / CREATININE URINE RATIO
CREATININE, URINE: 236.9 mg/dL
MICROALB UR: 0.68 mg/dL (ref 0.00–1.89)
Microalb Creat Ratio: 2.9 mg/g (ref 0.0–30.0)

## 2013-09-10 LAB — HIV ANTIBODY (ROUTINE TESTING W REFLEX): HIV: NONREACTIVE

## 2013-09-10 LAB — TESTOSTERONE: Testosterone: 198 ng/dL — ABNORMAL LOW (ref 300–890)

## 2013-09-10 LAB — RPR

## 2013-09-10 LAB — HEPATITIS B SURFACE ANTIBODY,QUALITATIVE: HEP B S AB: NEGATIVE

## 2013-09-10 LAB — HEPATITIS B CORE ANTIBODY, TOTAL: Hep B Core Total Ab: NONREACTIVE

## 2013-09-10 LAB — PSA: PSA: 0.5 ng/mL (ref <=4.00)

## 2013-09-10 LAB — HEPATITIS A ANTIBODY, TOTAL: HEP A TOTAL AB: NONREACTIVE

## 2013-09-11 ENCOUNTER — Other Ambulatory Visit: Payer: Self-pay | Admitting: Internal Medicine

## 2013-09-11 DIAGNOSIS — E782 Mixed hyperlipidemia: Secondary | ICD-10-CM

## 2013-09-11 MED ORDER — ATORVASTATIN CALCIUM 80 MG PO TABS: ORAL_TABLET | ORAL | Status: DC

## 2013-09-12 LAB — TB SKIN TEST
INDURATION: 0 mm
TB Skin Test: NEGATIVE

## 2013-09-12 LAB — TSH: TSH: 1.164 u[IU]/mL (ref 0.350–4.500)

## 2013-09-12 LAB — HEPATITIS B E ANTIBODY: Hepatitis Be Antibody: NEGATIVE

## 2014-06-26 ENCOUNTER — Other Ambulatory Visit: Payer: Self-pay | Admitting: Physician Assistant

## 2014-07-17 ENCOUNTER — Ambulatory Visit (INDEPENDENT_AMBULATORY_CARE_PROVIDER_SITE_OTHER): Payer: 59 | Admitting: Physician Assistant

## 2014-07-17 ENCOUNTER — Encounter (INDEPENDENT_AMBULATORY_CARE_PROVIDER_SITE_OTHER): Payer: Self-pay

## 2014-07-17 ENCOUNTER — Encounter: Payer: Self-pay | Admitting: Physician Assistant

## 2014-07-17 VITALS — BP 136/92 | HR 90 | Temp 98.4°F | Resp 18 | Wt 217.0 lb

## 2014-07-17 DIAGNOSIS — E349 Endocrine disorder, unspecified: Secondary | ICD-10-CM

## 2014-07-17 DIAGNOSIS — E291 Testicular hypofunction: Secondary | ICD-10-CM

## 2014-07-17 DIAGNOSIS — J0100 Acute maxillary sinusitis, unspecified: Secondary | ICD-10-CM

## 2014-07-17 DIAGNOSIS — R7309 Other abnormal glucose: Secondary | ICD-10-CM | POA: Insufficient documentation

## 2014-07-17 DIAGNOSIS — E559 Vitamin D deficiency, unspecified: Secondary | ICD-10-CM

## 2014-07-17 DIAGNOSIS — E785 Hyperlipidemia, unspecified: Secondary | ICD-10-CM

## 2014-07-17 DIAGNOSIS — I10 Essential (primary) hypertension: Secondary | ICD-10-CM

## 2014-07-17 DIAGNOSIS — R7303 Prediabetes: Secondary | ICD-10-CM

## 2014-07-17 DIAGNOSIS — E782 Mixed hyperlipidemia: Secondary | ICD-10-CM | POA: Insufficient documentation

## 2014-07-17 MED ORDER — PREDNISONE 20 MG PO TABS: ORAL_TABLET | ORAL | Status: AC

## 2014-07-17 MED ORDER — BENZONATATE 100 MG PO CAPS: 100.0000 mg | ORAL_CAPSULE | Freq: Four times a day (QID) | ORAL | Status: DC | PRN

## 2014-07-17 NOTE — Patient Instructions (Signed)
- Allegra OTC for allergies and to help with fluid behind ear drums.   -While drinking fluids, pinch and hold nose close and swallow.  This will help open up your eustachian tubes to drain the fluid behind your ear drums. -Try steam showers to open your nasal passages.  Drink lots of water to stay hydrated and to thin mucous. - Take Tessalon Perles as prescribed for cough  -Saline nasal spray.  Take 2 sprays in each nostril at bedtime.  Make sure you spray towards the outside of each nostril towards the outer corner of your eye, hold nose close and tilt head back.    -It can take up to 2 weeks to feel better.  Sinusitis is mostly caused by viruses.  -If you do not get better in 7-10 days (Have fever, facial pain, dental pain and swelling), then please call the office and I can send in an antibiotic.  -will call you with results of blood work. -Please keep physical appt in January 2016.    Sinusitis Sinusitis is redness, soreness, and inflammation of the paranasal sinuses. Paranasal sinuses are air pockets within the bones of your face (beneath the eyes, the middle of the forehead, or above the eyes). In healthy paranasal sinuses, mucus is able to drain out, and air is able to circulate through them by way of your nose. However, when your paranasal sinuses are inflamed, mucus and air can become trapped. This can allow bacteria and other germs to grow and cause infection. Sinusitis can develop quickly and last only a short time (acute) or continue over a long period (chronic). Sinusitis that lasts for more than 12 weeks is considered chronic.  CAUSES  Causes of sinusitis include:  Allergies.  Structural abnormalities, such as displacement of the cartilage that separates your nostrils (deviated septum), which can decrease the air flow through your nose and sinuses and affect sinus drainage.  Functional abnormalities, such as when the small hairs (cilia) that line your sinuses and help remove  mucus do not work properly or are not present. SIGNS AND SYMPTOMS  Symptoms of acute and chronic sinusitis are the same. The primary symptoms are pain and pressure around the affected sinuses. Other symptoms include:  Upper toothache.  Earache.  Headache.  Bad breath.  Decreased sense of smell and taste.  A cough, which worsens when you are lying flat.  Fatigue.  Fever.  Thick drainage from your nose, which often is green and may contain pus (purulent).  Swelling and warmth over the affected sinuses. DIAGNOSIS  Your health care provider will perform a physical exam. During the exam, your health care provider may:  Look in your nose for signs of abnormal growths in your nostrils (nasal polyps).  Tap over the affected sinus to check for signs of infection.  View the inside of your sinuses (endoscopy) using an imaging device that has a light attached (endoscope). If your health care provider suspects that you have chronic sinusitis, one or more of the following tests may be recommended:  Allergy tests.  Nasal culture. A sample of mucus is taken from your nose, sent to a lab, and screened for bacteria.  Nasal cytology. A sample of mucus is taken from your nose and examined by your health care provider to determine if your sinusitis is related to an allergy. TREATMENT  Most cases of acute sinusitis are related to a viral infection and will resolve on their own within 10 days. Sometimes medicines are prescribed to help  relieve symptoms (pain medicine, decongestants, nasal steroid sprays, or saline sprays).  However, for sinusitis related to a bacterial infection, your health care provider will prescribe antibiotic medicines. These are medicines that will help kill the bacteria causing the infection.  Rarely, sinusitis is caused by a fungal infection. In theses cases, your health care provider will prescribe antifungal medicine. For some cases of chronic sinusitis, surgery is  needed. Generally, these are cases in which sinusitis recurs more than 3 times per year, despite other treatments. HOME CARE INSTRUCTIONS   Drink plenty of water. Water helps thin the mucus so your sinuses can drain more easily.  Use a humidifier.  Inhale steam 3 to 4 times a day (for example, sit in the bathroom with the shower running).  Apply a warm, moist washcloth to your face 3 to 4 times a day, or as directed by your health care provider.  Use saline nasal sprays to help moisten and clean your sinuses.  Take medicines only as directed by your health care provider.  If you were prescribed either an antibiotic or antifungal medicine, finish it all even if you start to feel better. SEEK IMMEDIATE MEDICAL CARE IF:  You have increasing pain or severe headaches.  You have nausea, vomiting, or drowsiness.  You have swelling around your face.  You have vision problems.  You have a stiff neck.  You have difficulty breathing. MAKE SURE YOU:   Understand these instructions.  Will watch your condition.  Will get help right away if you are not doing well or get worse. Document Released: 08/18/2005 Document Revised: 01/02/2014 Document Reviewed: 09/02/2011 [facility] Patient Information 2015 [place], [place]. This information is not intended to replace advice given to you by your health care provider. Make sure you discuss any questions you have with your health care provider.  GIVE PT FOOD CHOICE LISTS FOR MEAL PLANNING  1)The amount of food you eat is important  -Too much can increase glucose levels and cause you to gain weight (which can  also increase glucose levels.  -Too little can decrease glucose level to unsafe levels (<70) 2)Eat meals and snacks at the same time each day to help your diabetes medication help you.  If you eat at different times each day, then the medication will not be as effective. 3)Do NOT skip meals or eat meals later than usual.  If you skip meals,  then your glucose level can go low (<70).  Eating meals later than usual will not help the medication work effectively. 4) Amount of carbohydrates (carbs) per meal  -Breakfast- 30-45 grams of carbs  -Lunch and Dinner- 45-60 grams of carbs  -Snacks- 15-30 grams of carbs 5)Low fat foods have no more than 3 grams of fat per serving.  -Saturated- look for less than 1 gram per serving  -Trans Fat- look for 0 grams per serving 6)Exercise at least 120 minutes per week  Exercise Benefits:   -Lower LDL (Bad cholesterol)   -Lower blood pressure   -Increase HDL (Good cholesterol)   -Strengthen heart, lungs and muscles   -Burn calories and relieve stress   -Sleep better and help you feel better overall  To lower your risk of heart disease, limit your intake of saturated fat and trans fat as much as possible. THE GOOD WHAT IT DOES WHERE IT'S FOUND  MONOUNSATURATED FAT Lowers LDL and maybe raises HDL cholesterol Canola oil, olives, olive oil, peanuts, peanut oil, avocados, nuts  POLYUNSATURATED FAT Lowers LDL cholesterol Corn, safflower,  sunflower and soybean oils, nuts, seeds  OMEGA-3 FATTY ACIDS Lowers triglycerides (blood fats) and blood pressure Salmon, mackerel, herring, sardines, flax seed, flaxseed oil, walnuts, soybean oil  THE BAD WHAT IT DOES WHERE IT'S FOUND  SATURATED FAT Raises LDL (bad) cholesterol Butter, shortening, lard, red meat, cheese, whole milk, ice cream, coconut and palm oils  TRANS FAT Raises LDL cholesterol, lowers HDL (good) cholesterol Fried foods, some stick margarines, some cookies and crackers (look for hydrogenated fat on the ingredient list)  CHOLESTEROL FROM FOOD Too much may raise cholesterol levels Meat, poultry, seafood, eggs, milk, cheese, yogurt, butter   Plate Method (How much food of each food group) 1) Fill one half of your plate with nonstarchy vegetables: lettuce, broccoli, green beans, spinach, carrots or peppers. 2) Fill one quarter with protein: chicken,  [place], fish, lean meat, eggs or tofu. 3) Fill one quarter with a nutritious carbohydrate food: brown rice, whole-wheat pasta, whole-wheat bread, peas, or corn.  Choose whole-wheat carbs for extra nutrition.  Controlling carbs helps you control your blood glucose. 4) Include a small piece of fruit at each meal, as well as 8 ounces of lowfat milk or yogurt. 5) Add 1-2 teaspoons of heart-healthy fat, such as olive or canola oil, trans fat-free margarine, avocado, nuts or seeds.  Metformin/Glucophage (Including extended release)  -Usually taken twice a day with breakfast and evening meal. -Effects- Decrease amount of glucose released from liver -Side Effects- Bloating, gas, diarrhea, upset stomach, loss of appetite (usually within first few weeks of starting) -Take with food to minimize symptoms. -Metformin is not likely to cause low blood sugar. -Metformin is avoided in patients with decreased kidney or liver function due to lactic acidosis.  - Metformin NEEDS TO BE STOPPED if you are having any imaging or surgical procedure that is using contrast dye.  We are starting you on Metformin to prevent or treat diabetes. Metformin does not cause low blood sugars. In order to create energy your cells need insulin and sugar but sometime your cells do not accept the insulin and this can cause increased sugars and decreased energy. The Metformin helps your cells accept insulin and the sugar to give you more energy.   The two most common side effects are nausea and diarrhea, follow these rules to avoid it! You can take imodium per box instructions when starting metformin if needed.   Rules of metformin: 1) start out slow with only one pill daily. Our goal for you is 4 pills a day or 2000mg  total.  2) take with your largest meal. 3) Take with least amount of carbs.   Ten Tips- Keys to Success for Managing Diabetes 1) Set realistic, achievable goals and expectations for yourself and for your diabetes.  -  Make a list of some "quick to fix" goals.  For example- check blood glucose 2  hours after supper, or change all sodas to diet soda.  -Set a date and time to check progress and add new goals. 2) Monitor your progress  -Keep a diary of your progress and slips on a daily basis.  Slips are normal and  to be expected. Research shows that people who routinely monitor their  progress are more likely to maintain weight loss and fitness goals.  -Don't try to do everything at once. Make changes gradually.  -Learn from what has worked for you in the past and do it again! 3) Plan ahead: what will make it easier for you to stick to your diabetes plan?  -  Plan for high-risk situations (such as parties) where you are likely to slip.  -Give yourself a variety of choices and alternatives that might work.  -Contact support people you can trust family, friends, spouses, coworkers.  -Join clubs or support groups and tell them what you need.  -List ways to reward yourself and praise yourself for positive changes, rather  than berate yourself for slips. 4) Be aware of the stumbling blocks  -Identify  Barriers that can make it hard to take care of your diabetes.  Are you  tempted to eat second portions of food?  Is it hard to check your blood glucose at  work or school?  -By focusing on major stumbling blocks to your diabetes care, you can find ways  around them. 5) Learning about diabetes is lifelong  -Treatments for diabetes are constantly improving based on new research.  Stay  informed.  -Learn as much as you can about meal planning, physical activity, medications  and reducing risks for complications. Bring questions to the office to discuss. 6) Manage stress and your emotional health.    -Pay attention to factors in your life that cause tension, wear you down or make  you anxious.  Work with your healthcare team to reduce stresses.  -Recognize early signs of depression and talk with your provider. 7) Be  Prepared  -Unlike people who do not have diabetes, you may have to plan aspects of your  day.  Also, you need to be ready to handle high and low blood sugar levels.   Being prepared can help you feel more at ease and in control.  -Practice problem-solving.  Ask yourself what if.Marland Kitchen.(I forgot my medicine; I eat too  much; I get sick; my blood glucose goes too high/low) and know how to respond  ahead of an emergency.  -Remember that you can continue to do the things that you liked to do before  you got diabetes. 8) Be Realistic  -You are not always going to be in perfect control of your diabetes.  You will  have good days and bad days.  That is OK.  -When you occasionally have a bad day, do not dwell on it.  Simply try to  continue without guilt or blame.  The goal is to do the best that you can, and  accept that no one is perfect. 9) Ask for support  -Tell your family and friends that you appreciate their support.  Let them know  what is and isn't helpful to you (something that is supportive to one person may  not be supportive to another.  -Tell family and friends specific ways they can help you follow your treatment  program. 10) Build a healthcare team  -You do not have to it alone. Doctors, Engineer, materials, nurse practitioners,  nurses, diabetes educators and behavioral specialists can help you solve the  tough problems and feel more confident about caring for your diabetes.

## 2014-07-17 NOTE — Progress Notes (Signed)
Subjective:    Patient ID: Juan Higgins, male    DOB: 1960-09-25, 53 y.o.   MRN: [id number]  Cough This is a new problem. Episode onset: Last tuesday. The problem has been gradually improving. The problem occurs constantly. Cough characteristics: little phelgm with gray and yellow color. Associated symptoms include chest pain, headaches, postnasal drip, rhinorrhea and wheezing. Pertinent negatives include no chills, ear congestion, ear pain, fever, hemoptysis, myalgias, nasal congestion, rash, sore throat, shortness of breath, sweats or weight loss. Exacerbated by: deep breath. Treatments tried: sudafed cough and congestion. The treatment provided no relief.   HPI 53 y.o. male  presents for 3 month follow up with hypertension, hyperlipidemia, prediabetes and vitamin D.  His blood pressure has not been controlled at home, today their BP is BP: (!) 136/92 mmHg He does workout does walking on treadmill and at office (10-25 minutes for 3-5 days per week) He denies chest pain, shortness of breath, dizziness.   He is not on cholesterol medication and denies myalgias.  He was taking Lipitor 80mg  but states he has not been taking it lately.  His cholesterol is not at goal. The cholesterol last visit was:   Lab Results  Component Value Date   CHOL 208* 09/09/2013   HDL 46 09/09/2013   LDLCALC 125* 09/09/2013   TRIG 183* 09/09/2013   CHOLHDL 4.5 09/09/2013   He has been working on diet but not exercise for prediabetes, and denies increased appetite, polydipsia and polyuria. Last A1C in the office was:  Lab Results  Component Value Date   HGBA1C 5.9* 09/09/2013   Currently manages prediabetes with?    Diet and exercise   Number of meals per day? 5  B- peanut butter and toast (wheat bread)  L- sandwich ([place])  D- meat and vegetable (chicken or pork chop and salad)  Snacks? Yogurt with granola  Beverages? Coffee (creamer and raw sugar) and water   Up to date Eye Exam? June 01, 2014-  Normal  Patient is on Vitamin D supplement.  Currently taking 8,000 IU daily. Lab Results  Component Value Date   VD25OH 4 09/09/2013     Current Medications:  Current Outpatient Prescriptions on File Prior to Visit  Medication Sig Dispense Refill  . ANDROGEL PUMP 20.25 MG/ACT (1.62%) GEL USE 2 PUMPS DAILY AS DIRECTED 75 g 0  . aspirin 81 MG tablet Take 81 mg by mouth daily.    Marland Kitchen atorvastatin (LIPITOR) 80 MG tablet 1 tablet daily for cholesterol 30 tablet 99  . Cholecalciferol (VITAMIN D-3) 1000 UNITS CAPS Take 8,000 Units by mouth daily.    . Testosterone (ANDROGEL) 40.5 MG/2.5GM (1.62%) GEL Apply 2 pumps daily 2.5 g 3   No current facility-administered medications on file prior to visit.   Medical History:  Past Medical History  Diagnosis Date  . Hyperlipidemia   . Hypertension   . Pre-diabetes   . Hypogonadism male   . 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 Negative for fever, chills, weight loss and diaphoresis.  HENT: Positive for postnasal drip, rhinorrhea, sinus pressure and voice change. Negative for congestion, ear discharge, ear pain, sore throat and trouble swallowing.   Eyes: Negative.   Respiratory: Positive for cough and wheezing. Negative for hemoptysis, chest tightness and shortness of breath.   Cardiovascular: Positive for chest pain.  Chest pain with cough only  Gastrointestinal: Positive for vomiting and abdominal pain. Negative for nausea, diarrhea and constipation.       Threw up from coughing this morning.  Endocrine: Negative.  Negative for polydipsia, polyphagia and polyuria.  Genitourinary: Negative.   Musculoskeletal: Negative.  Negative for myalgias.  Skin: Negative.  Negative for rash.  Neurological: Positive for light-headedness and headaches. Negative for dizziness,  speech difficulty and weakness.       Lightheaded with cough  Psychiatric/Behavioral: Negative.      BP 136/92 mmHg  Pulse 90  Temp(Src) 98.4 F (36.9 C)  Resp 18  Wt 217 lb (98.431 kg)  SpO2 96% Wt Readings from Last 3 Encounters:  07/17/14 217 lb (98.431 kg)  09/09/13 220 lb 3.2 oz (99.882 kg)  07/12/10 214 lb 2.1 oz (97.129 kg)   Objective:   Physical Exam  Vitals reviewed. General Appearance: Well nourished, in no apparent distress. Eyes: PERRLA, EOMs, conjunctiva no swelling or erythema Sinuses: Mild tenderness upon palpation of maxillary sinuses.  No Frontal tenderness ENT/Mouth: Ext aud canals clear, TMs without erythema, bulging. TMs with clear fluid behind them.  Turbinates erythematous bilaterally.  No erythema, swelling, or exudate on post pharynx.  Tonsils not swollen or erythematous. Hearing normal.  Neck: Supple, thyroid normal.  Respiratory: Respiratory effort normal, CTAB.  No w/r/r or stridor. Cardio: RRR with no MRGs. Brisk peripheral pulses without edema.  Abdomen: Soft, + normal BS.  Non tender, no guarding, rebound, hernias, masses. Lymphatics: Non tender without lymphadenopathy.  Musculoskeletal: Full ROM, 5/5 strength, normal gait.  Skin: Warm, dry without rashes, lesions, ecchymosis.  Neuro: Cranial nerves intact. Normal muscle tone, no cerebellar symptoms. Sensation intact.  Psych: Awake and oriented X 3, normal affect, Insight and Judgment appropriate.     Assessment & Plan:  Assessment and Plan:  1. Essential hypertension Monitor blood pressure at home. Continue DASH diet.  Reminder to go to the ER if any CP, SOB, nausea, dizziness, severe HA, changes vision/speech, left arm numbness and tingling, and jaw pain. - CBC with Differential - BASIC METABOLIC PANEL WITH GFR - Hepatic function panel  2. Vitamin D deficiency Check level and continue medications.  - Vit D  25 hydroxy   3. Hyperlipidemia Continue diet and exercise. Check cholesterol.   Might have to restart the Lipitor as prescribed. - CBC with Differential - BASIC METABOLIC PANEL WITH GFR - Hepatic function panel - Lipid panel  4. Testosterone deficiency Continue Androgel as prescribed.  Check level and monitor.  - BASIC METABOLIC PANEL WITH GFR - Hepatic function panel - Testosterone  5. Prediabetes Continue diet and exercise. Check A1C.  Might start on Metformin depending on lab results. - CBC with Differential - BASIC METABOLIC PANEL WITH GFR - Hepatic function panel - Hemoglobin A1c - Insulin, fasting  6. Acute maxillary sinusitis, recurrence not specified - Take the Prednisone as prescribed for inflammation - predniSONE (DELTASONE) 20 MG tablet; Take 3 tablets PO QDaily for 3 days, then take 2 tablets PO QDaily for 3 days, then take 1 tablet PO QDaily for 3 days  Dispense: 18 tablet; Refill: 0 - Take Tessalon Perles as prescribed for cough.- benzonatate (TESSALON PERLES) 100 MG capsule; Take 1 capsule (100 mg total) by mouth every 6 (six) hours as needed for cough.  Dispense: 60 capsule; Refill: 1 -Take Allegra OTC for allergies and help fluid behind ear drums. -While drinking fluids, pinch and hold nose close and swallow.  This will help open  up your eustachian tubes to drain the fluid behind your ear drums. -Try steam showers to open your nasal passages.  Drink lots of water to stay hydrated and to thin mucous. -Saline nasal spray. - Take 2 sprays in each nostril at bedtime.  Make sure you spray towards the outside of each nostril towards the outer corner of your eye, hold nose close and tilt head back.   -It can take up to 2 weeks to feel better.  Sinusitis is mostly caused by viruses. -If you do not get better in 7-10 days (Have fever, facial pain, dental pain and swelling), then please call the office and I can send in an antibiotic.  Discussed medication effects and SE's.  Pt agreed to treatment plan. Continue diet and meds as discussed. Further  disposition pending results of labs. Please keep physical appointment on 09/11/14 with Dr. Melford Aase.   Ciel Chervenak, Stephani Police, PA-C 4:36 PM [facility]

## 2014-07-18 LAB — CBC WITH DIFFERENTIAL/PLATELET
BASOS ABS: 0 10*3/uL (ref 0.0–0.1)
Basophils Relative: 0 % (ref 0–1)
EOS PCT: 2 % (ref 0–5)
Eosinophils Absolute: 0.2 10*3/uL (ref 0.0–0.7)
HCT: 46 % (ref 39.0–52.0)
Hemoglobin: 15.6 g/dL (ref 13.0–17.0)
Lymphocytes Relative: 28 % (ref 12–46)
Lymphs Abs: 2.3 10*3/uL (ref 0.7–4.0)
MCH: 28.6 pg (ref 26.0–34.0)
MCHC: 33.9 g/dL (ref 30.0–36.0)
MCV: 84.4 fL (ref 78.0–100.0)
MONO ABS: 0.6 10*3/uL (ref 0.1–1.0)
MPV: 10.9 fL (ref 9.4–12.4)
Monocytes Relative: 8 % (ref 3–12)
Neutro Abs: 5 10*3/uL (ref 1.7–7.7)
Neutrophils Relative %: 62 % (ref 43–77)
Platelets: 215 10*3/uL (ref 150–400)
RBC: 5.45 MIL/uL (ref 4.22–5.81)
RDW: 14 % (ref 11.5–15.5)
WBC: 8.1 10*3/uL (ref 4.0–10.5)

## 2014-07-18 LAB — BASIC METABOLIC PANEL WITH GFR
BUN: 11 mg/dL (ref 6–23)
CHLORIDE: 103 meq/L (ref 96–112)
CO2: 30 mEq/L (ref 19–32)
Calcium: 9.5 mg/dL (ref 8.4–10.5)
Creat: 0.97 mg/dL (ref 0.50–1.35)
GFR, EST NON AFRICAN AMERICAN: 88 mL/min
GFR, Est African American: >89 mL/min
Glucose, Bld: 85 mg/dL (ref 70–99)
POTASSIUM: 4.5 meq/L (ref 3.5–5.3)
Sodium: 143 mEq/L (ref 135–145)

## 2014-07-18 LAB — LIPID PANEL
Cholesterol: 202 mg/dL — ABNORMAL HIGH (ref 0–200)
HDL: 44 mg/dL (ref >39)
LDL Cholesterol: 123 mg/dL — ABNORMAL HIGH (ref 0–99)
Total CHOL/HDL Ratio: 4.6 Ratio
Triglycerides: 175 mg/dL — ABNORMAL HIGH (ref <150)
VLDL: 35 mg/dL (ref 0–40)

## 2014-07-18 LAB — HEPATIC FUNCTION PANEL
ALBUMIN: 4.3 g/dL (ref 3.5–5.2)
ALK PHOS: 48 U/L (ref 39–117)
ALT: 26 U/L (ref 0–53)
AST: 18 U/L (ref 0–37)
BILIRUBIN INDIRECT: 0.4 mg/dL (ref 0.2–1.2)
BILIRUBIN TOTAL: 0.5 mg/dL (ref 0.2–1.2)
Bilirubin, Direct: 0.1 mg/dL (ref 0.0–0.3)
Total Protein: 7 g/dL (ref 6.0–8.3)

## 2014-07-18 LAB — VITAMIN D 25 HYDROXY (VIT D DEFICIENCY, FRACTURES): Vit D, 25-Hydroxy: 54 ng/mL (ref 30–100)

## 2014-07-18 LAB — TESTOSTERONE: Testosterone: 113 ng/dL — ABNORMAL LOW (ref 300–890)

## 2014-07-18 LAB — HEMOGLOBIN A1C
HEMOGLOBIN A1C: 5.6 % (ref <5.7)
Mean Plasma Glucose: 114 mg/dL (ref <117)

## 2014-07-18 LAB — INSULIN, FASTING: Insulin fasting, serum: 11.7 u[IU]/mL (ref 2.0–19.6)

## 2014-08-09 ENCOUNTER — Other Ambulatory Visit: Payer: Self-pay | Admitting: Physician Assistant

## 2014-08-09 MED ORDER — TESTOSTERONE 20.25 MG/ACT (1.62%) TD GEL: TRANSDERMAL | Status: DC

## 2014-09-11 ENCOUNTER — Encounter: Payer: Self-pay | Admitting: Internal Medicine

## 2014-09-11 ENCOUNTER — Ambulatory Visit (INDEPENDENT_AMBULATORY_CARE_PROVIDER_SITE_OTHER): Payer: 59 | Admitting: Internal Medicine

## 2014-09-11 VITALS — BP 128/90 | HR 76 | Temp 98.4°F | Resp 16 | Ht 68.0 in | Wt 220.0 lb

## 2014-09-11 DIAGNOSIS — Z111 Encounter for screening for respiratory tuberculosis: Secondary | ICD-10-CM

## 2014-09-11 DIAGNOSIS — I10 Essential (primary) hypertension: Secondary | ICD-10-CM

## 2014-09-11 DIAGNOSIS — N528 Other male erectile dysfunction: Secondary | ICD-10-CM

## 2014-09-11 DIAGNOSIS — E669 Obesity, unspecified: Secondary | ICD-10-CM

## 2014-09-11 DIAGNOSIS — E349 Endocrine disorder, unspecified: Secondary | ICD-10-CM

## 2014-09-11 DIAGNOSIS — C20 Malignant neoplasm of rectum: Secondary | ICD-10-CM

## 2014-09-11 DIAGNOSIS — M1000 Idiopathic gout, unspecified site: Secondary | ICD-10-CM

## 2014-09-11 DIAGNOSIS — R7303 Prediabetes: Secondary | ICD-10-CM

## 2014-09-11 DIAGNOSIS — Z125 Encounter for screening for malignant neoplasm of prostate: Secondary | ICD-10-CM

## 2014-09-11 DIAGNOSIS — E785 Hyperlipidemia, unspecified: Secondary | ICD-10-CM

## 2014-09-11 DIAGNOSIS — E559 Vitamin D deficiency, unspecified: Secondary | ICD-10-CM

## 2014-09-11 DIAGNOSIS — Z79899 Other long term (current) drug therapy: Secondary | ICD-10-CM

## 2014-09-11 DIAGNOSIS — Z0000 Encounter for general adult medical examination without abnormal findings: Secondary | ICD-10-CM

## 2014-09-11 LAB — CBC WITH DIFFERENTIAL/PLATELET
Basophils Absolute: 0 10*3/uL (ref 0.0–0.1)
Basophils Relative: 0 % (ref 0–1)
EOS PCT: 2 % (ref 0–5)
Eosinophils Absolute: 0.1 10*3/uL (ref 0.0–0.7)
HEMATOCRIT: 44.8 % (ref 39.0–52.0)
Hemoglobin: 15.5 g/dL (ref 13.0–17.0)
LYMPHS PCT: 29 % (ref 12–46)
Lymphs Abs: 1.9 10*3/uL (ref 0.7–4.0)
MCH: 28.9 pg (ref 26.0–34.0)
MCHC: 34.6 g/dL (ref 30.0–36.0)
MCV: 83.6 fL (ref 78.0–100.0)
MPV: 11.3 fL (ref 8.6–12.4)
Monocytes Absolute: 0.5 10*3/uL (ref 0.1–1.0)
Monocytes Relative: 8 % (ref 3–12)
NEUTROS PCT: 61 % (ref 43–77)
Neutro Abs: 4 10*3/uL (ref 1.7–7.7)
PLATELETS: 198 10*3/uL (ref 150–400)
RBC: 5.36 MIL/uL (ref 4.22–5.81)
RDW: 14.5 % (ref 11.5–15.5)
WBC: 6.5 10*3/uL (ref 4.0–10.5)

## 2014-09-11 MED ORDER — TADALAFIL 5 MG PO TABS: 5.0000 mg | ORAL_TABLET | Freq: Every day | ORAL | Status: DC | PRN

## 2014-09-11 NOTE — Patient Instructions (Signed)

## 2014-09-11 NOTE — Progress Notes (Signed)
Patient ID: Juan Higgins, male   DOB: December 17, 1960, 54 y.o.   MRN: [id number] Annual Comprehensive Examination  This very nice 54 y.o.MWM presents for complete physical.  Patient has been followed for HTN, Prediabetes, Hyperlipidemia, and Vitamin D Deficiency. In 2009 patient underwent left hemicolectomy & LLQ colostomy for Carcinoma of the rectum. Last colonoscopsy was in 21013 and he is due 3 yr f/u colon in 2016.    HTN predates since     . Patient's BP has been controlled at home.Today's BP: 128/90 mmHg. Patient denies any cardiac symptoms as chest pain, palpitations, shortness of breath, dizziness or ankle swelling. In 2009 , pat underwent left hemi-colectomy for a rectosigmoid carcinoma.    Patient's hyperlipidemia is controlled with diet and medications. Patient denies myalgias or other medication SE's. Last lipids were not at goal off treatment - Total Chol 202*; HDL 44; LDL  123*; [id number] on 07/17/2014. Currently patient is off Tx .    Patient has prediabetes since Nov 2012 with an A1c of 5.7%  and patient denies reactive hypoglycemic symptoms, visual blurring, diabetic polys or paresthesias.  A1c was 5.9% in Jan 2915 and last A1c was  5.6% on 07/17/2014.   Patient has hx/o Testosterone Deficiency with low testosterone 198 in Jan 2015. Finally, patient has history of Vitamin D Deficiency of 14 in 2009 and last vitamin D was  54 on  07/17/2014.  Medication Sig  . aspirin 81 MG tablet Take 81 mg by mouth daily.  Marland Kitchen atorvastatin (LIPITOR) 80 MG tablet 1 tablet daily for cholesterol  . Cholecalciferol (VITAMIN D-3) 1000 UNITS CAPS Take 8,000 Units by mouth daily.  . Testosterone (ANDROGEL PUMP) 20.25 MG/ACT (1.62%) GEL USE 2 PUMPS DAILY AS DIRECTED  . Testosterone (ANDROGEL) 40.5 MG/2.5GM (1.62%) GEL Apply 2 pumps daily   No Known Allergies   Past Medical History  Diagnosis Date  . Hyperlipidemia   . Hypertension   . Pre-diabetes   . Hypogonadism male   . Rectal adenocarcinoma 2009   colostomy   Health Maintenance  Topic Date Due  . COLONOSCOPY  06/01/2009  . INFLUENZA VACCINE  04/01/2014  . TETANUS/TDAP  02/16/2018   Immunization History  Administered Date(s) Administered  . PPD Test 09/09/2013, 09/11/2014  . Tdap 02/17/2008   Past Surgical History  Procedure Laterality Date  . Hernia repair    . Reconstruct / stabilize distal ulna    . Colon surgery N/A 2009    AP Rectal Resection  . Colonoscopsy  09/2011   Family History  Problem Relation Age of Onset  . COPD Mother   . Alcohol abuse Father   . Cirrhosis Father   . COPD Sister   . Alcohol abuse Brother   . Diabetes Maternal Grandmother     History   Social History  . Marital Status: Married    Spouse Name: N/A    Number of Children: N/A  . Years of Education: N/A   Occupational History  . Not on file.   Social History Main Topics  . Smoking status: Never Smoker   . Smokeless tobacco: Not on file  . Alcohol Use: Yes     Comment: occasional  . Drug Use: No  . Sexual Activity: Not on file    ROS Constitutional: Denies fever, chills, weight loss/gain, headaches, insomnia, fatigue, night sweats or change in appetite. Eyes: Denies redness, blurred vision, diplopia, discharge, itchy or watery eyes.  ENT: Denies discharge, congestion, post nasal drip, epistaxis, sore throat, earache, hearing loss,  dental pain, Tinnitus, Vertigo, Sinus pain or snoring.  Cardio: Denies chest pain, palpitations, irregular heartbeat, syncope, dyspnea, diaphoresis, orthopnea, PND, claudication or edema Respiratory: denies cough, dyspnea, DOE, pleurisy, hoarseness, laryngitis or wheezing.  Gastrointestinal: Denies dysphagia, heartburn, reflux, water brash, pain, cramps, nausea, vomiting, bloating, diarrhea, constipation, hematemesis, melena, hematochezia, jaundice or hemorrhoids Genitourinary: Denies dysuria, frequency, urgency, nocturia, hesitancy, discharge, hematuria or flank pain Musculoskeletal: Denies  arthralgia, myalgia, stiffness, Jt. Swelling, pain, limp or strain/sprain. Denies Falls. Skin: Denies puritis, rash, hives, warts, acne, eczema or change in skin lesion Neuro: No weakness, tremor, incoordination, spasms, paresthesia or pain Psychiatric: Denies confusion, memory loss or sensory loss. Denies Depression. Endocrine: Denies change in weight, skin, hair change, nocturia, and paresthesia, diabetic polys, visual blurring or hyper / hypo glycemic episodes.  Heme/Lymph: No excessive bleeding, bruising or enlarged lymph nodes.  Physical Exam  BP 128/90   Pulse 76  Temp 98.4 F   Resp 16  Ht 5\' 8"    Wt 220 lb     BMI 33.46   General Appearance: Well nourished, in no apparent distress. Eyes: PERRLA, EOMs, conjunctiva no swelling or erythema, normal fundi and vessels. Sinuses: No frontal/maxillary tenderness ENT/Mouth: EACs patent / TMs  nl. Nares clear without erythema, swelling, mucoid exudates. Oral hygiene is good. No erythema, swelling, or exudate. Tongue normal, non-obstructing. Tonsils not swollen or erythematous. Hearing normal.  Neck: Supple, thyroid normal. No bruits, nodes or JVD. Respiratory: Respiratory effort normal.  BS equal and clear bilateral without rales, rhonci, wheezing or stridor. Cardio: Heart sounds are normal with regular rate and rhythm and no murmurs, rubs or gallops. Peripheral pulses are normal and equal bilaterally without edema. No aortic or femoral bruits. Chest: symmetric with normal excursions and percussion.  Abdomen: Flat, soft, with bowl sounds. Nontender, no guarding, rebound, hernias, masses, or organomegaly. LLQ colostomy. Lymphatics: Non tender without lymphadenopathy.  Genitourinary: Declined Musculoskeletal: Full ROM all peripheral extremities, joint stability, 5/5 strength, and normal gait. Skin: Warm and dry without rashes, lesions, cyanosis, clubbing or  ecchymosis.  Neuro: Cranial nerves intact, reflexes equal bilaterally. Normal muscle  tone, no cerebellar symptoms. Sensation intact.  Pysch: Awake and oriented X 3 with normal affect, insight and judgment appropriate.   Assessment and Plan   1. Hypertension, labile  2. Hyperlipidemia, off Tx 3. Pre Diabetes 4. Vitamin D Deficiency 5. Hx/o ColoRectal Cancer (2009)  6. Testosterone Deficiency   Continue prudent diet as discussed, weight control, BP monitoring, regular exercise, and medications as discussed.  Discussed med effects and SE's. Routine screening labs and tests as requested with regular follow-up as recommended.

## 2014-09-12 LAB — URINALYSIS, MICROSCOPIC ONLY
BACTERIA UA: NONE SEEN
Casts: NONE SEEN
Crystals: NONE SEEN
Squamous Epithelial / LPF: NONE SEEN

## 2014-09-12 LAB — BASIC METABOLIC PANEL WITH GFR
BUN: 13 mg/dL (ref 6–23)
CALCIUM: 9.7 mg/dL (ref 8.4–10.5)
CO2: 28 meq/L (ref 19–32)
Chloride: 103 mEq/L (ref 96–112)
Creat: 0.96 mg/dL (ref 0.50–1.35)
GFR, EST NON AFRICAN AMERICAN: 89 mL/min
GFR, Est African American: >89 mL/min
Glucose, Bld: 93 mg/dL (ref 70–99)
POTASSIUM: 4.5 meq/L (ref 3.5–5.3)
Sodium: 141 mEq/L (ref 135–145)

## 2014-09-12 LAB — LIPID PANEL
CHOL/HDL RATIO: 4 ratio
Cholesterol: 194 mg/dL (ref 0–200)
HDL: 48 mg/dL (ref >39)
LDL Cholesterol: 127 mg/dL — ABNORMAL HIGH (ref 0–99)
TRIGLYCERIDES: 94 mg/dL (ref <150)
VLDL: 19 mg/dL (ref 0–40)

## 2014-09-12 LAB — HEMOGLOBIN A1C
HEMOGLOBIN A1C: 5.8 % — AB (ref <5.7)
Mean Plasma Glucose: 120 mg/dL — ABNORMAL HIGH (ref <117)

## 2014-09-12 LAB — MAGNESIUM: Magnesium: 1.9 mg/dL (ref 1.5–2.5)

## 2014-09-12 LAB — HEPATIC FUNCTION PANEL
ALK PHOS: 41 U/L (ref 39–117)
ALT: 36 U/L (ref 0–53)
AST: 20 U/L (ref 0–37)
Albumin: 4.3 g/dL (ref 3.5–5.2)
Bilirubin, Direct: 0.2 mg/dL (ref 0.0–0.3)
Indirect Bilirubin: 0.7 mg/dL (ref 0.2–1.2)
Total Bilirubin: 0.9 mg/dL (ref 0.2–1.2)
Total Protein: 6.6 g/dL (ref 6.0–8.3)

## 2014-09-12 LAB — VITAMIN D 25 HYDROXY (VIT D DEFICIENCY, FRACTURES): VIT D 25 HYDROXY: 76 ng/mL (ref 30–100)

## 2014-09-12 LAB — PSA: PSA: 0.52 ng/mL (ref <=4.00)

## 2014-09-12 LAB — MICROALBUMIN / CREATININE URINE RATIO
Creatinine, Urine: 221.6 mg/dL
Microalb Creat Ratio: 2.7 mg/g (ref 0.0–30.0)
Microalb, Ur: 0.6 mg/dL (ref <2.0)

## 2014-09-12 LAB — TSH: TSH: 1.264 u[IU]/mL (ref 0.350–4.500)

## 2014-09-12 LAB — TESTOSTERONE: TESTOSTERONE: 343 ng/dL (ref 300–890)

## 2014-09-12 LAB — INSULIN, FASTING: INSULIN FASTING, SERUM: 15.9 u[IU]/mL (ref 2.0–19.6)

## 2014-09-27 ENCOUNTER — Other Ambulatory Visit: Payer: Self-pay

## 2014-09-27 MED ORDER — TESTOSTERONE 20.25 MG/ACT (1.62%) TD GEL: TRANSDERMAL | Status: DC

## 2014-12-22 ENCOUNTER — Ambulatory Visit: Payer: Self-pay | Admitting: Physician Assistant

## 2015-02-21 ENCOUNTER — Encounter: Payer: Self-pay | Admitting: Gastroenterology

## 2015-03-30 ENCOUNTER — Ambulatory Visit: Payer: Self-pay | Admitting: Internal Medicine

## 2015-04-12 ENCOUNTER — Other Ambulatory Visit: Payer: Self-pay | Admitting: Internal Medicine

## 2015-09-19 DIAGNOSIS — Z79899 Other long term (current) drug therapy: Secondary | ICD-10-CM | POA: Insufficient documentation

## 2015-09-19 DIAGNOSIS — Z85048 Personal history of other malignant neoplasm of rectum, rectosigmoid junction, and anus: Secondary | ICD-10-CM | POA: Insufficient documentation

## 2015-09-19 DIAGNOSIS — Z933 Colostomy status: Secondary | ICD-10-CM | POA: Insufficient documentation

## 2015-09-19 NOTE — Patient Instructions (Signed)

## 2015-09-19 NOTE — Progress Notes (Signed)
Patient ID: Juan Higgins, male   DOB: 06-16-1961, 55 y.o.   MRN: [id number]  Annual  Screening/Preventative Visit And Comprehensive Evaluation & Examination      This very nice 55 y.o. WWM presents for presents for a Wellness/Preventative Visit & comprehensive evaluation and management of multiple medical co-morbidities.  Patient has been followed for HTN, Prediabetes, Hyperlipidemia and Vitamin D Deficiency. In 2009, patient had a rectal cancer removed with Colostomy creation.      HTN predates since 2012. Patient's BP has been controlled at home.Today's BP is 138/96. Patient denies any cardiac symptoms as chest pain, palpitations, shortness of breath, dizziness or ankle swelling.      Patient's hyperlipidemia is controlled with diet and medications. Patient denies myalgias or other medication SE's. Last lipids were not at goal with T Chol 194, HDL 48 , and elevated LDL 127 in Jan 2016. Patient was Rx'd Atorvastatin, but since he never filled Rx, he has not returned for f/u.      Patient has prediabetes since Nov 2012 with A1c 5.7% and then 5.5% in Dec 2012 and patient denies reactive hypoglycemic symptoms, visual blurring, diabetic polys or paresthesias. Last A1c was 5.8% in Jan 2016.        Patient also has hx/o Low T of 260 in 2009 & has been on replacement therapy with androgel. Finally, patient has history of Vitamin D Deficiency of "14" in 2009 and last vitamin D was 31 in Jan 2016.   Medication Sig  . ANDROGEL PUMP  (1.62%) GEL USE 2 PUMPS DAILY AS DIRECTED  . aspirin 81 MG tablet Take 81 mg by mouth daily.  Marland Kitchen atorvastatin  80 MG tablet 1 tablet daily for cholesterol (Patient not taking: Reported on 09/11/2014)  . VITAMIN D 1000 UNITS  Take 8,000 Units by mouth daily.  . tadalafil (CIALIS) 5 MG tablet Take 1 tablet (5 mg total) by mouth daily as needed for erectile dysfunction.   NKA  Past Medical History  Diagnosis Date  . Hyperlipidemia   . Hypertension   . Pre-diabetes   .  Hypogonadism male   . Rectal adenocarcinoma ([place]) 2009    colostomy   Health Maintenance  Topic Date Due  . INFLUENZA VACCINE  04/02/2015  . TETANUS/TDAP  02/16/2018  . COLONOSCOPY  04/17/2018  . Hepatitis C Screening  Completed  . HIV Screening  Completed   Immunization History  Administered Date(s) Administered  . PPD Test 09/09/2013, 09/11/2014  . Tdap 02/17/2008   Past Surgical History  Procedure Laterality Date  . Hernia repair    . Reconstruct / stabilize distal ulna    . Colon surgery N/A 2009    AP Rectal Resection  . Colonoscopsy  09/2011   Family History  Problem Relation Age of Onset  . COPD Mother   . Alcohol abuse Father   . Cirrhosis Father   . COPD Sister   . Alcohol abuse Brother   . Diabetes Maternal Grandmother     Social History   Social History  . Marital Status: Married    Spouse Name: N/A  . Number of Children: N/A  . Years of Education: N/A   Occupational History  . Not on file.   Social History Main Topics  . Smoking status: Never Smoker   . Smokeless tobacco: Not on file  . Alcohol Use: Yes     Comment: occasional  . Drug Use: No  . Sexual Activity: Not on file    ROS Constitutional: Denies  fever, chills, weight loss/gain, headaches, insomnia,  night sweats or change in appetite. Does c/o fatigue. Eyes: Denies redness, blurred vision, diplopia, discharge, itchy or watery eyes.  ENT: Denies discharge, congestion, post nasal drip, epistaxis, sore throat, earache, hearing loss, dental pain, Tinnitus, Vertigo, Sinus pain or snoring.  Cardio: Denies chest pain, palpitations, irregular heartbeat, syncope, dyspnea, diaphoresis, orthopnea, PND, claudication or edema Respiratory: denies cough, dyspnea, DOE, pleurisy, hoarseness, laryngitis or wheezing.  Gastrointestinal: Denies dysphagia, heartburn, reflux, water brash, pain, cramps, nausea, vomiting, bloating, diarrhea, constipation, hematemesis, melena, hematochezia, jaundice or  hemorrhoids Genitourinary: Denies dysuria, frequency, urgency, nocturia, hesitancy, discharge, hematuria or flank pain Musculoskeletal: Denies arthralgia, myalgia, stiffness, Jt. Swelling, pain, limp or strain/sprain. Denies Falls. Skin: Denies puritis, rash, hives, warts, acne, eczema or change in skin lesion Neuro: No weakness, tremor, incoordination, spasms, paresthesia or pain Psychiatric: Denies confusion, memory loss or sensory loss. Denies Depression. Endocrine: Denies change in weight, skin, hair change, nocturia, and paresthesia, diabetic polys, visual blurring or hyper / hypo glycemic episodes.  Heme/Lymph: No excessive bleeding, bruising or enlarged lymph nodes.  Physical Exam  BP 138/96 mmHg  Pulse 76  Temp(Src) 97.3 F (36.3 C)  Resp 16  Ht 5\' 8"  (1.727 m)  Wt 214 lb 12.8 oz (97.433 kg)  BMI 32.67 kg/m2  General Appearance: Well nourished, in no apparent distress. Eyes: PERRLA, EOMs, conjunctiva no swelling or erythema, normal fundi and vessels. Sinuses: No frontal/maxillary tenderness ENT/Mouth: EACs patent / TMs  nl. Nares clear without erythema, swelling, mucoid exudates. Oral hygiene is good. No erythema, swelling, or exudate. Tongue normal, non-obstructing. Tonsils not swollen or erythematous. Hearing normal.  Neck: Supple, thyroid normal. No bruits, nodes or JVD. Respiratory: Respiratory effort normal.  BS equal and clear bilateral without rales, rhonci, wheezing or stridor. Cardio: Heart sounds are normal with regular rate and rhythm and no murmurs, rubs or gallops. Peripheral pulses are normal and equal bilaterally without edema. No aortic or femoral bruits. Chest: symmetric with normal excursions and percussion.  Abdomen: Soft, with Nl bowel sounds. Nontender, no guarding, rebound, hernias, masses, or organomegaly. LLQ functioning Colostomy. Lymphatics: Non tender without lymphadenopathy.  Genitourinary: deferred Musculoskeletal: Full ROM all peripheral  extremities, joint stability, 5/5 strength, and normal gait. Skin: Warm and dry without rashes, lesions, cyanosis, clubbing or  ecchymosis. (+) tender plantar wart of the Left foot.  Neuro: Cranial nerves intact, reflexes equal bilaterally. Normal muscle tone, no cerebellar symptoms. Sensation intact.  Pysch: Alert and oriented X 3 with normal affect, insight and judgment appropriate.   Assessment and Plan  1. Annual Preventative/Screening Exam   - Microalbumin / creatinine urine ratio - EKG 12-Lead - [place], RETROPERITNL ABD,  LTD - POC Hemoccult Bld/Stl  - Urinalysis, Routine w reflex microscopic  - Vitamin B12 - Iron and TIBC - PSA - Testosterone - CBC with Differential/Platelet - BASIC METABOLIC PANEL WITH GFR - Hepatic function panel - Magnesium - Lipid panel - TSH - Hemoglobin A1c - Insulin, random - VITAMIN D 25 Hydroxy   2. Essential hypertension  - Microalbumin / creatinine urine ratio - EKG 12-Lead - [place], RETROPERITNL ABD,  LTD - TSH  3. Hyperlipidemia  - Lipid panel - TSH  4. Prediabetes  - Hemoglobin A1c - Insulin, random  5. Vitamin D deficiency  - VITAMIN D 25 Hydroxy   6. Idiopathic gout   7. Obesity   8. Testosterone deficiency  - Testosterone  9. Colostomy  ([place])   10. Prostate cancer screening  - PSA  11. History of rectal cancer  - POC Hemoccult Bld/Stl  - Testosterone  12. Medication management  - Urinalysis, Routine w reflex microscopic  - CBC with Differential/Platelet - BASIC METABOLIC PANEL WITH GFR - Hepatic function panel - Magnesium  13. Other fatigue  - Vitamin B12 - Iron and TIBC   Continue prudent diet as discussed, weight control, BP monitoring, regular exercise, and medications as discussed.  Discussed med effects and SE's. Routine screening labs and tests as requested with regular follow-up as recommended. Over 40 minutes of exam, counseling, chart review and high complex critical decision making was  performed

## 2015-09-20 ENCOUNTER — Ambulatory Visit (INDEPENDENT_AMBULATORY_CARE_PROVIDER_SITE_OTHER): Payer: 59 | Admitting: Internal Medicine

## 2015-09-20 ENCOUNTER — Encounter: Payer: Self-pay | Admitting: Internal Medicine

## 2015-09-20 VITALS — BP 138/96 | HR 76 | Temp 97.3°F | Resp 16 | Ht 68.0 in | Wt 214.8 lb

## 2015-09-20 DIAGNOSIS — R5383 Other fatigue: Secondary | ICD-10-CM

## 2015-09-20 DIAGNOSIS — R7303 Prediabetes: Secondary | ICD-10-CM

## 2015-09-20 DIAGNOSIS — E349 Endocrine disorder, unspecified: Secondary | ICD-10-CM

## 2015-09-20 DIAGNOSIS — Z85048 Personal history of other malignant neoplasm of rectum, rectosigmoid junction, and anus: Secondary | ICD-10-CM

## 2015-09-20 DIAGNOSIS — E785 Hyperlipidemia, unspecified: Secondary | ICD-10-CM

## 2015-09-20 DIAGNOSIS — Z0000 Encounter for general adult medical examination without abnormal findings: Secondary | ICD-10-CM | POA: Diagnosis not present

## 2015-09-20 DIAGNOSIS — E669 Obesity, unspecified: Secondary | ICD-10-CM

## 2015-09-20 DIAGNOSIS — E559 Vitamin D deficiency, unspecified: Secondary | ICD-10-CM

## 2015-09-20 DIAGNOSIS — I10 Essential (primary) hypertension: Secondary | ICD-10-CM | POA: Diagnosis not present

## 2015-09-20 DIAGNOSIS — Z79899 Other long term (current) drug therapy: Secondary | ICD-10-CM

## 2015-09-20 DIAGNOSIS — Z933 Colostomy status: Secondary | ICD-10-CM

## 2015-09-20 DIAGNOSIS — Z111 Encounter for screening for respiratory tuberculosis: Secondary | ICD-10-CM

## 2015-09-20 DIAGNOSIS — Z125 Encounter for screening for malignant neoplasm of prostate: Secondary | ICD-10-CM

## 2015-09-20 DIAGNOSIS — Z0001 Encounter for general adult medical examination with abnormal findings: Secondary | ICD-10-CM

## 2015-09-20 DIAGNOSIS — M1000 Idiopathic gout, unspecified site: Secondary | ICD-10-CM

## 2015-09-20 MED ORDER — BISOPROLOL-HYDROCHLOROTHIAZIDE 5-6.25 MG PO TABS: ORAL_TABLET | ORAL | Status: DC

## 2015-09-21 LAB — BASIC METABOLIC PANEL WITH GFR
BUN: 14 mg/dL (ref 7–25)
CALCIUM: 9.4 mg/dL (ref 8.6–10.3)
CO2: 25 mmol/L (ref 20–31)
Chloride: 104 mmol/L (ref 98–110)
Creat: 0.85 mg/dL (ref 0.70–1.33)
GLUCOSE: 113 mg/dL — AB (ref 65–99)
Potassium: 4.3 mmol/L (ref 3.5–5.3)
SODIUM: 139 mmol/L (ref 135–146)

## 2015-09-21 LAB — URINALYSIS, ROUTINE W REFLEX MICROSCOPIC
BILIRUBIN URINE: NEGATIVE
GLUCOSE, UA: NEGATIVE
Hgb urine dipstick: NEGATIVE
KETONES UR: NEGATIVE
Leukocytes, UA: NEGATIVE
Nitrite: NEGATIVE
PROTEIN: NEGATIVE
SPECIFIC GRAVITY, URINE: 1.016 (ref 1.001–1.035)
pH: 6 (ref 5.0–8.0)

## 2015-09-21 LAB — IRON AND TIBC
%SAT: 30 % (ref 15–60)
Iron: 86 ug/dL (ref 50–180)
TIBC: 289 ug/dL (ref 250–425)
UIBC: 203 ug/dL (ref 125–400)

## 2015-09-21 LAB — LIPID PANEL
CHOLESTEROL: 181 mg/dL (ref 125–200)
HDL: 50 mg/dL (ref >=40)
LDL Cholesterol: 116 mg/dL (ref <130)
TRIGLYCERIDES: 74 mg/dL (ref <150)
Total CHOL/HDL Ratio: 3.6 Ratio (ref <=5.0)
VLDL: 15 mg/dL (ref <30)

## 2015-09-21 LAB — MAGNESIUM: MAGNESIUM: 2 mg/dL (ref 1.5–2.5)

## 2015-09-21 LAB — CBC WITH DIFFERENTIAL/PLATELET
BASOS PCT: 0 % (ref 0–1)
Basophils Absolute: 0 10*3/uL (ref 0.0–0.1)
EOS PCT: 2 % (ref 0–5)
Eosinophils Absolute: 0.1 10*3/uL (ref 0.0–0.7)
HCT: 45.6 % (ref 39.0–52.0)
Hemoglobin: 15.8 g/dL (ref 13.0–17.0)
Lymphocytes Relative: 27 % (ref 12–46)
Lymphs Abs: 1.5 10*3/uL (ref 0.7–4.0)
MCH: 29.3 pg (ref 26.0–34.0)
MCHC: 34.6 g/dL (ref 30.0–36.0)
MCV: 84.4 fL (ref 78.0–100.0)
MONO ABS: 0.4 10*3/uL (ref 0.1–1.0)
MPV: 10.6 fL (ref 8.6–12.4)
Monocytes Relative: 7 % (ref 3–12)
Neutro Abs: 3.5 10*3/uL (ref 1.7–7.7)
Neutrophils Relative %: 64 % (ref 43–77)
PLATELETS: 183 10*3/uL (ref 150–400)
RBC: 5.4 MIL/uL (ref 4.22–5.81)
RDW: 14.4 % (ref 11.5–15.5)
WBC: 5.4 10*3/uL (ref 4.0–10.5)

## 2015-09-21 LAB — MICROALBUMIN / CREATININE URINE RATIO
CREATININE, URINE: 146 mg/dL (ref 20–370)
MICROALB/CREAT RATIO: 2 ug/mg{creat} (ref <30)
Microalb, Ur: 0.3 mg/dL

## 2015-09-21 LAB — HEPATIC FUNCTION PANEL
ALBUMIN: 4.3 g/dL (ref 3.6–5.1)
ALT: 29 U/L (ref 9–46)
AST: 18 U/L (ref 10–35)
Alkaline Phosphatase: 38 U/L — ABNORMAL LOW (ref 40–115)
BILIRUBIN DIRECT: 0.2 mg/dL (ref <=0.2)
BILIRUBIN INDIRECT: 0.6 mg/dL (ref 0.2–1.2)
TOTAL PROTEIN: 6.4 g/dL (ref 6.1–8.1)
Total Bilirubin: 0.8 mg/dL (ref 0.2–1.2)

## 2015-09-21 LAB — TESTOSTERONE: TESTOSTERONE: 240 ng/dL — AB (ref 250–827)

## 2015-09-21 LAB — HEMOGLOBIN A1C
HEMOGLOBIN A1C: 5.9 % — AB (ref <5.7)
MEAN PLASMA GLUCOSE: 123 mg/dL — AB (ref <117)

## 2015-09-21 LAB — INSULIN, RANDOM: INSULIN: 37.4 u[IU]/mL — AB (ref 2.0–19.6)

## 2015-09-21 LAB — VITAMIN B12: Vitamin B-12: 482 pg/mL (ref 211–911)

## 2015-09-21 LAB — TSH: TSH: 1.37 u[IU]/mL (ref 0.350–4.500)

## 2015-09-21 LAB — VITAMIN D 25 HYDROXY (VIT D DEFICIENCY, FRACTURES): VIT D 25 HYDROXY: 75 ng/mL (ref 30–100)

## 2015-09-21 LAB — PSA: PSA: 0.45 ng/mL (ref <=4.00)

## 2015-10-23 ENCOUNTER — Ambulatory Visit: Payer: Self-pay | Admitting: Internal Medicine

## 2015-10-29 ENCOUNTER — Encounter: Payer: Self-pay | Admitting: Gastroenterology

## 2015-11-11 ENCOUNTER — Other Ambulatory Visit: Payer: Self-pay | Admitting: Internal Medicine

## 2015-11-11 DIAGNOSIS — E349 Endocrine disorder, unspecified: Secondary | ICD-10-CM

## 2015-11-19 ENCOUNTER — Encounter: Payer: Self-pay | Admitting: Internal Medicine

## 2015-11-30 ENCOUNTER — Encounter: Payer: Self-pay | Admitting: Internal Medicine

## 2016-01-01 ENCOUNTER — Ambulatory Visit: Payer: Self-pay | Admitting: Internal Medicine

## 2016-04-04 ENCOUNTER — Encounter: Payer: Self-pay | Admitting: Internal Medicine

## 2016-04-07 ENCOUNTER — Encounter: Payer: Self-pay | Admitting: Internal Medicine

## 2016-04-07 ENCOUNTER — Ambulatory Visit (INDEPENDENT_AMBULATORY_CARE_PROVIDER_SITE_OTHER): Payer: 59 | Admitting: Internal Medicine

## 2016-04-07 VITALS — BP 122/80 | HR 72 | Temp 98.0°F | Resp 16 | Ht 68.0 in | Wt 212.0 lb

## 2016-04-07 DIAGNOSIS — E559 Vitamin D deficiency, unspecified: Secondary | ICD-10-CM

## 2016-04-07 DIAGNOSIS — Z79899 Other long term (current) drug therapy: Secondary | ICD-10-CM | POA: Diagnosis not present

## 2016-04-07 DIAGNOSIS — E291 Testicular hypofunction: Secondary | ICD-10-CM | POA: Diagnosis not present

## 2016-04-07 DIAGNOSIS — R7303 Prediabetes: Secondary | ICD-10-CM

## 2016-04-07 DIAGNOSIS — I10 Essential (primary) hypertension: Secondary | ICD-10-CM

## 2016-04-07 DIAGNOSIS — E785 Hyperlipidemia, unspecified: Secondary | ICD-10-CM

## 2016-04-07 DIAGNOSIS — E349 Endocrine disorder, unspecified: Secondary | ICD-10-CM

## 2016-04-07 DIAGNOSIS — M1000 Idiopathic gout, unspecified site: Secondary | ICD-10-CM | POA: Diagnosis not present

## 2016-04-07 LAB — BASIC METABOLIC PANEL WITH GFR
BUN: 17 mg/dL (ref 7–25)
CALCIUM: 9.4 mg/dL (ref 8.6–10.3)
CO2: 24 mmol/L (ref 20–31)
CREATININE: 1.05 mg/dL (ref 0.70–1.33)
Chloride: 104 mmol/L (ref 98–110)
GFR, EST NON AFRICAN AMERICAN: 79 mL/min (ref >=60)
GLUCOSE: 106 mg/dL — AB (ref 65–99)
Potassium: 4.2 mmol/L (ref 3.5–5.3)
SODIUM: 140 mmol/L (ref 135–146)

## 2016-04-07 LAB — CBC WITH DIFFERENTIAL/PLATELET
BASOS PCT: 0 %
Basophils Absolute: 0 cells/uL (ref 0–200)
EOS ABS: 142 {cells}/uL (ref 15–500)
Eosinophils Relative: 2 %
HEMATOCRIT: 43.3 % (ref 38.5–50.0)
HEMOGLOBIN: 14.9 g/dL (ref 13.2–17.1)
LYMPHS ABS: 2059 {cells}/uL (ref 850–3900)
Lymphocytes Relative: 29 %
MCH: 28.4 pg (ref 27.0–33.0)
MCHC: 34.4 g/dL (ref 32.0–36.0)
MCV: 82.6 fL (ref 80.0–100.0)
MONO ABS: 497 {cells}/uL (ref 200–950)
MONOS PCT: 7 %
MPV: 11 fL (ref 7.5–12.5)
NEUTROS ABS: 4402 {cells}/uL (ref 1500–7800)
Neutrophils Relative %: 62 %
Platelets: 195 10*3/uL (ref 140–400)
RBC: 5.24 MIL/uL (ref 4.20–5.80)
RDW: 14.3 % (ref 11.0–15.0)
WBC: 7.1 10*3/uL (ref 3.8–10.8)

## 2016-04-07 LAB — HEPATIC FUNCTION PANEL
ALT: 26 U/L (ref 9–46)
AST: 19 U/L (ref 10–35)
Albumin: 4.4 g/dL (ref 3.6–5.1)
Alkaline Phosphatase: 44 U/L (ref 40–115)
BILIRUBIN DIRECT: 0.1 mg/dL (ref <=0.2)
BILIRUBIN INDIRECT: 0.5 mg/dL (ref 0.2–1.2)
TOTAL PROTEIN: 6.6 g/dL (ref 6.1–8.1)
Total Bilirubin: 0.6 mg/dL (ref 0.2–1.2)

## 2016-04-07 LAB — LIPID PANEL
CHOLESTEROL: 176 mg/dL (ref 125–200)
HDL: 44 mg/dL (ref >=40)
LDL Cholesterol: 93 mg/dL (ref <130)
TRIGLYCERIDES: 194 mg/dL — AB (ref <150)
Total CHOL/HDL Ratio: 4 Ratio (ref <=5.0)
VLDL: 39 mg/dL — AB (ref <30)

## 2016-04-07 LAB — MAGNESIUM: MAGNESIUM: 2 mg/dL (ref 1.5–2.5)

## 2016-04-07 NOTE — Patient Instructions (Signed)

## 2016-04-07 NOTE — Progress Notes (Signed)
[facility]                       [name], M.D.        [name]. [name], P.A.-C       [name], P.A.-C   [facility]                [address] [place], [place] [id number] Telephone [phone] Telefax [phone] ______________________________________________________________________     This very nice 55 y.o.MWM presents for 6 month follow up with Hypertension, Hyperlipidemia, Pre-Diabetes and Vitamin D Deficiency. Patient underwent Surgery for Rectal cancer in 2009 with a colostomy and has had no recurrence. Patient had been off of his Allopurinol and had a recent flare of sx's he suspected gout and the sx's resolved after several days of resuming his Allopurinol.       Patient is treated for HTN circa 2012 & BP has been controlled at home. Today's BP: 122/80. Patient has had no complaints of any cardiac type chest pain, palpitations, dyspnea/orthopnea/PND, dizziness, claudication, or dependent edema.     Hyperlipidemia is not controlled with diet. Previous LDL was 127 & patient was prescribed Lipitor which he never filled. Last Lipids were still not at goal: Lab Results  Component Value Date   CHOL 181 09/20/2015   HDL 50 09/20/2015   LDLCALC 116 09/20/2015   TRIG 74 09/20/2015   CHOLHDL 3.6 09/20/2015      Also, the patient has history of PreDiabetes with A1c 5.7% in Nov 2012 and has had no symptoms of reactive hypoglycemia, diabetic polys, paresthesias or visual blurring.  Last A1c was still not at goal: Lab Results  Component Value Date   HGBA1C 5.9 (H) 09/20/2015      Patient has hx/o Low Testosterone 260 in 2012 and has declined therapy. Further, the patient also has history of Vitamin D Deficiency of "14" in 2009 and supplements vitamin D without any suspected side-effects. Last vitamin D was at goal: Lab Results  Component Value Date   VD25OH 34 09/20/2015   Current  Outpatient Prescriptions on File Prior to Visit  Medication Sig  . ANDROGEL PUMP 20.25 MG/ACT (1.62%) GEL USE 2 PUMPS DAILY AS DIRECTED  . aspirin 81 MG tablet Take 81 mg by mouth daily.  . Cholecalciferol (VITAMIN D-3) 1000 UNITS CAPS Take 8,000 Units by mouth daily.  Marland Kitchen atorvastatin (LIPITOR) 80 MG tablet 1 tablet daily for cholesterol (Patient not taking: Reported on 09/11/2014)  . bisoprolol-hydrochlorothiazide (ZIAC) 5-6.25 MG tablet Take 1 tablet every morning for BP   PMHx:   Past Medical History:  Diagnosis Date  . Hyperlipidemia   . Hypertension   . Hypogonadism male   . Pre-diabetes   . 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 PPD Test 09/09/2013, 09/11/2014, 09/20/2015  . Tdap 02/17/2008   Past Surgical History:  Procedure Laterality Date  . COLON SURGERY N/A 2009   AP Rectal Resection  . colonoscopsy  09/2011  . HERNIA REPAIR    . RECONSTRUCT / STABILIZE DISTAL ULNA     FHx:    Reviewed / unchanged  SHx:    Reviewed / unchanged  Systems Review:  Constitutional: Denies fever, chills, wt changes, headaches, insomnia, fatigue, night sweats, change in appetite. Eyes: Denies redness, blurred vision, diplopia,  discharge, itchy, watery eyes.  ENT: Denies discharge, congestion, post nasal drip, epistaxis, sore throat, earache, hearing loss, dental pain, tinnitus, vertigo, sinus pain, snoring.  CV: Denies chest pain, palpitations, irregular heartbeat, syncope, dyspnea, diaphoresis, orthopnea, PND, claudication or edema. Respiratory: denies cough, dyspnea, DOE, pleurisy, hoarseness, laryngitis, wheezing.  Gastrointestinal: Denies dysphagia, odynophagia, heartburn, reflux, water brash, abdominal pain or cramps, nausea, vomiting, bloating, diarrhea, constipation, hematemesis, melena, hematochezia  or hemorrhoids. Genitourinary: Denies dysuria, frequency, urgency, nocturia, hesitancy, discharge, hematuria or flank  pain. Musculoskeletal: Denies arthralgias, myalgias, stiffness, jt. swelling, pain, limping or strain/sprain.  Skin: Denies pruritus, rash, hives, warts, acne, eczema or change in skin lesion(s). Neuro: No weakness, tremor, incoordination, spasms, paresthesia or pain. Psychiatric: Denies confusion, memory loss or sensory loss. Endo: Denies change in weight, skin or hair change.  Heme/Lymph: No excessive bleeding, bruising or enlarged lymph nodes.  Physical Exam  BP 122/80   Pulse 72   Temp 98 F (36.7 C)   Resp 16   Ht 5\' 8"  (1.727 m)   Wt 212 lb (96.2 kg)   BMI 32.23 kg/m   Appears well nourished and in no distress. Eyes: PERRLA, EOMs, conjunctiva no swelling or erythema. Sinuses: No frontal/maxillary tenderness ENT/Mouth: EAC's clear, TM's nl w/o erythema, bulging. Nares clear w/o erythema, swelling, exudates. Oropharynx clear without erythema or exudates. Oral hygiene is good. Tongue normal, non obstructing. Hearing intact.  Neck: Supple. Thyroid nl. Car 2+/2+ without bruits, nodes or JVD. Chest: Respirations nl with BS clear & equal w/o rales, rhonchi, wheezing or stridor.  Cor: Heart sounds normal w/ regular rate and rhythm without sig. murmurs, gallops, clicks, or rubs. Peripheral pulses normal and equal  without edema.  Abdomen: Soft & bowel sounds normal. Non-tender w/o guarding, rebound, hernias, masses, or organomegaly.  Lymphatics: Unremarkable.  Musculoskeletal: Full ROM all peripheral extremities, joint stability, 5/5 strength, and normal gait.  Skin: Warm, dry without exposed rashes, lesions or ecchymosis apparent.  Neuro: Cranial nerves intact, reflexes equal bilaterally. Sensory-motor testing grossly intact. Tendon reflexes grossly intact.  Pysch: Alert & oriented x 3.  Insight and judgement nl & appropriate. No ideations.  Assessment and Plan:  1. Essential hypertension  - Continue medication, monitor blood pressure at home. Continue DASH diet. Reminder to go  to the ER if any CP, SOB, nausea, dizziness, severe HA, changes vision/speech, left arm numbness and tingling and jaw pain. - TSH  2. Prediabetes  - Continue diet/meds, exercise,& lifestyle modifications. Continue monitor periodic cholesterol/liver & renal functions  - Hemoglobin A1c - Insulin, random  3. Hyperlipidemia  - Continue diet, exercise, lifestyle modifications. Monitor appropriate labs. - Lipid panel - TSH  4. Vitamin D deficiency   - Continue supplementation. - VITAMIN D 25 Hydroxy  5. Testosterone deficiency  - Testosterone  6. Idiopathic gout   7. Medication management  - CBC with Differential/Platelet - BASIC METABOLIC PANEL WITH GFR - Hepatic function panel - Magnesium     Recommended regular exercise, BP monitoring, weight control, and discussed med and SE's. Recommended labs to assess and monitor clinical status. Further disposition pending results of labs. Over 30 minutes of exam, counseling, chart review was performed

## 2016-04-08 LAB — INSULIN, RANDOM: Insulin: 23.6 u[IU]/mL — ABNORMAL HIGH (ref 2.0–19.6)

## 2016-04-08 LAB — VITAMIN D 25 HYDROXY (VIT D DEFICIENCY, FRACTURES): Vit D, 25-Hydroxy: 73 ng/mL (ref 30–100)

## 2016-04-08 LAB — TESTOSTERONE: TESTOSTERONE: 158 ng/dL — AB (ref 250–827)

## 2016-04-08 LAB — TSH: TSH: 1.6 mIU/L (ref 0.40–4.50)

## 2016-04-08 LAB — HEMOGLOBIN A1C
HEMOGLOBIN A1C: 5.1 % (ref <5.7)
MEAN PLASMA GLUCOSE: 100 mg/dL

## 2016-05-16 ENCOUNTER — Other Ambulatory Visit: Payer: Self-pay | Admitting: *Deleted

## 2016-05-16 DIAGNOSIS — E349 Endocrine disorder, unspecified: Secondary | ICD-10-CM

## 2016-05-16 MED ORDER — TESTOSTERONE 20.25 MG/ACT (1.62%) TD GEL: TRANSDERMAL | 5 refills | Status: DC

## 2016-07-08 ENCOUNTER — Encounter: Payer: Self-pay | Admitting: Physician Assistant

## 2016-07-08 ENCOUNTER — Ambulatory Visit (INDEPENDENT_AMBULATORY_CARE_PROVIDER_SITE_OTHER): Payer: 59 | Admitting: Physician Assistant

## 2016-07-08 VITALS — BP 130/74 | HR 88 | Temp 97.6°F | Resp 16 | Ht 68.0 in | Wt 217.0 lb

## 2016-07-08 DIAGNOSIS — E559 Vitamin D deficiency, unspecified: Secondary | ICD-10-CM

## 2016-07-08 DIAGNOSIS — H65193 Other acute nonsuppurative otitis media, bilateral: Secondary | ICD-10-CM

## 2016-07-08 DIAGNOSIS — Z79899 Other long term (current) drug therapy: Secondary | ICD-10-CM

## 2016-07-08 DIAGNOSIS — E785 Hyperlipidemia, unspecified: Secondary | ICD-10-CM

## 2016-07-08 DIAGNOSIS — M1000 Idiopathic gout, unspecified site: Secondary | ICD-10-CM

## 2016-07-08 DIAGNOSIS — I10 Essential (primary) hypertension: Secondary | ICD-10-CM

## 2016-07-08 DIAGNOSIS — E349 Endocrine disorder, unspecified: Secondary | ICD-10-CM

## 2016-07-08 DIAGNOSIS — R7303 Prediabetes: Secondary | ICD-10-CM

## 2016-07-08 LAB — CBC WITH DIFFERENTIAL/PLATELET
BASOS ABS: 0 {cells}/uL (ref 0–200)
Basophils Relative: 0 %
EOS ABS: 158 {cells}/uL (ref 15–500)
Eosinophils Relative: 2 %
HCT: 44.8 % (ref 38.5–50.0)
HEMOGLOBIN: 15 g/dL (ref 13.2–17.1)
LYMPHS ABS: 2133 {cells}/uL (ref 850–3900)
Lymphocytes Relative: 27 %
MCH: 28.9 pg (ref 27.0–33.0)
MCHC: 33.5 g/dL (ref 32.0–36.0)
MCV: 86.3 fL (ref 80.0–100.0)
MPV: 10.5 fL (ref 7.5–12.5)
Monocytes Absolute: 632 cells/uL (ref 200–950)
Monocytes Relative: 8 %
NEUTROS ABS: 4977 {cells}/uL (ref 1500–7800)
Neutrophils Relative %: 63 %
Platelets: 194 10*3/uL (ref 140–400)
RBC: 5.19 MIL/uL (ref 4.20–5.80)
RDW: 14.1 % (ref 11.0–15.0)
WBC: 7.9 10*3/uL (ref 3.8–10.8)

## 2016-07-08 LAB — TSH: TSH: 1.48 m[IU]/L (ref 0.40–4.50)

## 2016-07-08 NOTE — Patient Instructions (Signed)

## 2016-07-08 NOTE — Progress Notes (Addendum)
Assessment and Plan:   Hypertension -Continue medication, monitor blood pressure at home. Continue DASH diet.  Reminder to go to the ER if any CP, SOB, nausea, dizziness, severe HA, changes vision/speech, left arm numbness and tingling and jaw pain.  Cholesterol -Continue diet and exercise. Check cholesterol.    Prediabetes  -Continue diet and exercise. Check A1C  Vitamin D Def - check level and continue medications.   Hypogonadism - continue replacement therapy, check testosterone levels as needed.  - may switch to injections or clomid  Morbid Obesity with co morbidities - long discussion about weight loss, diet, and exercise  Effusion bilateral ear Will refer to ENT for audiology/eval   Continue diet and meds as discussed. Further disposition pending results of labs. Over 30 minutes of exam, counseling, chart review, and critical decision making was performed  Future Appointments Date Time Provider [place]  10/08/2016 9:00 AM [name], MD [facility] None     HPI 55 y.o. male  presents for 3 month follow up on hypertension, cholesterol, prediabetes, and vitamin D deficiency.  S/p colostomy from 2009.  His blood pressure has been controlled at home, today their BP is BP: 130/74  He does workout. He denies chest pain, shortness of breath, dizziness.  He is not on cholesterol medication and is not on Lipitor at this time His cholesterol is at goal. The cholesterol last visit was:   Lab Results  Component Value Date   CHOL 176 04/07/2016   HDL 44 04/07/2016   LDLCALC 93 04/07/2016   TRIG 194 (H) 04/07/2016   CHOLHDL 4.0 04/07/2016    He has been working on diet and exercise for prediabetes, and denies paresthesia of the feet, polydipsia, polyuria and visual disturbances. Last A1C in the office was:  Lab Results  Component Value Date   HGBA1C 5.1 04/07/2016   Patient is on Vitamin D supplement.   Lab Results  Component Value Date   VD25OH 35  04/07/2016     He has a history of testosterone deficiency and is on testosterone replacement, on 3 pumps each shoulder. He states that the testosterone helps with his energy, libido, muscle mass. Lab Results  Component Value Date   TESTOSTERONE 158 (L) 04/07/2016   BMI is Body mass index is 32.99 kg/m., he is working on diet and exercise. Wt Readings from Last 3 Encounters:  07/08/16 217 lb (98.4 kg)  04/07/16 212 lb (96.2 kg)  09/20/15 214 lb 12.8 oz (97.4 kg)     Current Medications:  Current Outpatient Prescriptions on File Prior to Visit  Medication Sig Dispense Refill  . aspirin 81 MG tablet Take 81 mg by mouth daily.    . Cholecalciferol (VITAMIN D-3) 1000 UNITS CAPS Take 8,000 Units by mouth daily.    . Testosterone (ANDROGEL PUMP) 20.25 MG/ACT (1.62%) GEL USE 2 PUMPS DAILY AS DIRECTED 75 g 5  . atorvastatin (LIPITOR) 80 MG tablet 1 tablet daily for cholesterol (Patient not taking: Reported on 09/11/2014) 30 tablet 99  . bisoprolol-hydrochlorothiazide (ZIAC) 5-6.25 MG tablet Take 1 tablet every morning for BP 90 tablet 1   No current facility-administered medications on file prior to visit.    Medical History:  Past Medical History:  Diagnosis Date  . Hyperlipidemia   . Hypertension   . Hypogonadism male   . Pre-diabetes   . 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  HENT: Positive for ear pain and hearing loss.   Eyes: Negative.   Respiratory: Negative.   Cardiovascular: Negative.   Gastrointestinal: Negative.   Genitourinary: Negative.   Musculoskeletal: Negative.   Skin: Negative.     Family history- Review and unchanged Social history- Review and unchanged Physical Exam: BP 130/74   Pulse 88   Temp 97.6 F (36.4 C)   Resp 16   Ht 5\' 8"  (1.727 m)   Wt 217 lb (98.4 kg)   SpO2 99%   BMI 32.99 kg/m  Wt Readings from Last 3 Encounters:  07/08/16 217  lb (98.4 kg)  04/07/16 212 lb (96.2 kg)  09/20/15 214 lb 12.8 oz (97.4 kg)   General Appearance: Well nourished, in no apparent distress. Eyes: PERRLA, EOMs, conjunctiva no swelling or erythema Sinuses: No Frontal/maxillary tenderness ENT/Mouth: Ext aud canals clear, TMs without erythema, bulging. No erythema, swelling, or exudate on post pharynx.  Tonsils not swollen or erythematous. Hearing normal.  Neck: Supple, thyroid normal.  Respiratory: Respiratory effort normal, BS equal bilaterally without rales, rhonchi, wheezing or stridor.  Cardio: RRR with no MRGs. Brisk peripheral pulses without edema.  Abdomen: Soft, + BS,  Non tender, no guarding, rebound, hernias, masses. Lymphatics: Non tender without lymphadenopathy.  Musculoskeletal: Full ROM, 5/5 strength, Normal gait Skin: Warm, dry without rashes, lesions, ecchymosis.  Neuro: Cranial nerves intact. Normal muscle tone, no cerebellar symptoms. Psych: Awake and oriented X 3, normal affect, Insight and Judgment appropriate.    Vicie Mutters, PA-C 4:12 PM [facility]

## 2016-07-09 LAB — LIPID PANEL
CHOL/HDL RATIO: 4.5 ratio (ref <5.0)
Cholesterol: 191 mg/dL (ref <200)
HDL: 42 mg/dL (ref >40)
LDL CALC: 107 mg/dL — AB
Triglycerides: 212 mg/dL — ABNORMAL HIGH (ref <150)
VLDL: 42 mg/dL — ABNORMAL HIGH (ref <30)

## 2016-07-09 LAB — BASIC METABOLIC PANEL WITH GFR
BUN: 18 mg/dL (ref 7–25)
CHLORIDE: 102 mmol/L (ref 98–110)
CO2: 24 mmol/L (ref 20–31)
CREATININE: 0.95 mg/dL (ref 0.70–1.33)
Calcium: 9.5 mg/dL (ref 8.6–10.3)
GFR, Est African American: >89 mL/min (ref >=60)
GFR, Est Non African American: 88 mL/min (ref >=60)
Glucose, Bld: 111 mg/dL — ABNORMAL HIGH (ref 65–99)
Potassium: 4 mmol/L (ref 3.5–5.3)
SODIUM: 139 mmol/L (ref 135–146)

## 2016-07-09 LAB — HEPATIC FUNCTION PANEL
ALT: 30 U/L (ref 9–46)
AST: 21 U/L (ref 10–35)
Albumin: 4.4 g/dL (ref 3.6–5.1)
Alkaline Phosphatase: 47 U/L (ref 40–115)
BILIRUBIN DIRECT: 0.1 mg/dL (ref <=0.2)
Indirect Bilirubin: 0.5 mg/dL (ref 0.2–1.2)
Total Bilirubin: 0.6 mg/dL (ref 0.2–1.2)
Total Protein: 6.6 g/dL (ref 6.1–8.1)

## 2016-07-09 LAB — TESTOSTERONE: TESTOSTERONE: 239 ng/dL — AB (ref 250–827)

## 2016-07-09 LAB — MAGNESIUM: MAGNESIUM: 2.1 mg/dL (ref 1.5–2.5)

## 2016-07-23 NOTE — Addendum Note (Signed)
Addended by: Vicie Mutters R on: 07/23/2016 08:49 AM   Modules accepted: Orders

## 2016-07-31 ENCOUNTER — Encounter: Payer: Self-pay | Admitting: Gastroenterology

## 2016-08-21 ENCOUNTER — Other Ambulatory Visit: Payer: Self-pay | Admitting: Internal Medicine

## 2016-10-01 ENCOUNTER — Ambulatory Visit (AMBULATORY_SURGERY_CENTER): Payer: Self-pay

## 2016-10-01 ENCOUNTER — Telehealth: Payer: Self-pay

## 2016-10-01 VITALS — Ht 67.5 in | Wt 218.6 lb

## 2016-10-01 DIAGNOSIS — C20 Malignant neoplasm of rectum: Secondary | ICD-10-CM

## 2016-10-01 MED ORDER — METOCLOPRAMIDE HCL 5 MG PO TABS: 10.0000 mg | ORAL_TABLET | ORAL | Status: DC | PRN

## 2016-10-01 MED ORDER — SUPREP BOWEL PREP KIT 17.5-3.13-1.6 GM/177ML PO SOLN: 1.0000 | Freq: Once | ORAL | 0 refills | Status: AC

## 2016-10-01 NOTE — Progress Notes (Signed)
No allergies to eggs or soy No diet meds No home oxygen No past problems with anesthesia  Declined emmi 

## 2016-10-01 NOTE — Telephone Encounter (Signed)
Dr Ardis Hughs,    Pt complained of nausea with last prep, which was MOVI.  OK I ordered him Reglan x2 if needed (during his PV with me)? Thank you, Angela/PV

## 2016-10-01 NOTE — Telephone Encounter (Signed)
Thank you.  dj

## 2016-10-02 ENCOUNTER — Encounter: Payer: Self-pay | Admitting: Internal Medicine

## 2016-10-08 ENCOUNTER — Encounter: Payer: Self-pay | Admitting: Internal Medicine

## 2016-10-15 ENCOUNTER — Encounter: Payer: 59 | Admitting: Gastroenterology

## 2016-10-21 ENCOUNTER — Ambulatory Visit (AMBULATORY_SURGERY_CENTER): Payer: 59 | Admitting: Gastroenterology

## 2016-10-21 ENCOUNTER — Encounter: Payer: Self-pay | Admitting: Gastroenterology

## 2016-10-21 VITALS — BP 130/93 | HR 71 | Temp 98.7°F | Resp 16 | Ht 67.5 in | Wt 218.0 lb

## 2016-10-21 DIAGNOSIS — Z1211 Encounter for screening for malignant neoplasm of colon: Secondary | ICD-10-CM | POA: Diagnosis not present

## 2016-10-21 DIAGNOSIS — Z85048 Personal history of other malignant neoplasm of rectum, rectosigmoid junction, and anus: Secondary | ICD-10-CM | POA: Diagnosis present

## 2016-10-21 DIAGNOSIS — C20 Malignant neoplasm of rectum: Secondary | ICD-10-CM

## 2016-10-21 MED ORDER — SODIUM CHLORIDE 0.9 % IV SOLN: 500.0000 mL | INTRAVENOUS | Status: DC

## 2016-10-21 NOTE — Patient Instructions (Signed)
YOU HAD AN ENDOSCOPIC PROCEDURE TODAY AT THE [facility]:   Refer to the procedure report that was given to you for any specific questions about what was found during the examination.  If the procedure report does not answer your questions, please call your gastroenterologist to clarify.  If you requested that your care partner not be given the details of your procedure findings, then the procedure report has been included in a sealed envelope for you to review at your convenience later.  YOU SHOULD EXPECT: Some feelings of bloating in the abdomen. Passage of more gas than usual.  Walking can help get rid of the air that was put into your GI tract during the procedure and reduce the bloating. If you had a lower endoscopy (such as a colonoscopy or flexible sigmoidoscopy) you may notice spotting of blood in your stool or on the toilet paper. If you underwent a bowel prep for your procedure, you may not have a normal bowel movement for a few days.  Please Note:  You might notice some irritation and congestion in your nose or some drainage.  This is from the oxygen used during your procedure.  There is no need for concern and it should clear up in a day or so.  SYMPTOMS TO REPORT IMMEDIATELY:   Following lower endoscopy (colonoscopy or flexible sigmoidoscopy):  Excessive amounts of blood in the stool  Significant tenderness or worsening of abdominal pains  Swelling of the abdomen that is new, acute  Fever of 100F or higher   For urgent or emergent issues, a gastroenterologist can be reached at any hour by calling [phone].   DIET:  We do recommend a small meal at first, but then you may proceed to your regular diet.  Drink plenty of fluids but you should avoid alcoholic beverages for 24 hours.  ACTIVITY:  You should plan to take it easy for the rest of today and you should NOT DRIVE or use heavy machinery until tomorrow (because of the sedation medicines used during the test).     FOLLOW UP: Our staff will call the number listed on your records the next business day following your procedure to check on you and address any questions or concerns that you may have regarding the information given to you following your procedure. If we do not reach you, we will leave a message.  However, if you are feeling well and you are not experiencing any problems, there is no need to return our call.  We will assume that you have returned to your regular daily activities without incident.  If any biopsies were taken you will be contacted by phone or by letter within the next 1-3 weeks.  Please call us at [phone] if you have not heard about the biopsies in 3 weeks.    SIGNATURES/CONFIDENTIALITY: You and/or your care partner have signed paperwork which will be entered into your electronic medical record.  These signatures attest to the fact that that the information above on your After Visit Summary has been reviewed and is understood.  Full responsibility of the confidentiality of this discharge information lies with you and/or your care-partner.   Resume medications.  

## 2016-10-21 NOTE — Progress Notes (Signed)
Pt's states no medical or surgical changes since previsit or office visit. 

## 2016-10-21 NOTE — Progress Notes (Signed)
A/ox3 pleased with MAC, report to [facility]

## 2016-10-21 NOTE — Op Note (Signed)
[facility] Patient Name: Juan Higgins Procedure Date: 10/21/2016 9:01 AM MRN: [id number] Endoscopist: Milus Banister , MD Age: 56 Referring MD:  Date of Birth: [birth year] Gender: Male Account #: [id number] Procedure:                Colonoscopy Indications:              High risk colon cancer surveillance: Personal                            history of colon cancer (rectal cancer s/p APR and                            permanent colostomy Dr. Morton Stall at [facility], colonoscopy                            2012 Dr. Sharlett Iles no polyps) Medicines:                Monitored Anesthesia Care Procedure:                Pre-Anesthesia Assessment:                           - Prior to the procedure, a History and Physical                            was performed, and patient medications and                            allergies were reviewed. The patient's tolerance of                            previous anesthesia was also reviewed. The risks                            and benefits of the procedure and the sedation                            options and risks were discussed with the patient.                            All questions were answered, and informed consent                            was obtained. Prior Anticoagulants: The patient has                            taken no previous anticoagulant or antiplatelet                            agents. ASA Grade Assessment: II - A patient with                            mild systemic disease. After reviewing the risks  and benefits, the patient was deemed in                            satisfactory condition to undergo the procedure.                           After obtaining informed consent, the colonoscope                            was passed under direct vision. Throughout the                            procedure, the patient's blood pressure, pulse, and                            oxygen saturations were monitored  continuously. The                            Model CF-HQ190L [id number] scope was introduced                            through the left sided colostomy and advanced to                            the the cecum, identified by appendiceal orifice                            and ileocecal valve. The colonoscopy was performed                            without difficulty. The patient tolerated the                            procedure well. The quality of the bowel                            preparation was excellent. The ileocecal valve and                            the appendiceal orifice were photographed. Scope In: 9:08:38 AM Scope Out: 9:19:32 AM Scope Withdrawal Time: 0 hours 8 minutes 53 seconds  Total Procedure Duration: 0 hours 10 minutes 54 seconds  Findings:                 The entire examined colon appeared normal via left                            sided ostomy.                           S/p APR 2009; no Hartman's pouch. Complications:            No immediate complications. Estimated blood loss:  None. Estimated Blood Loss:     Estimated blood loss: none. Impression:               - The entire examined colon is normal (examined via                            ostomy).                           - No specimens collected. Recommendation:           - Patient has a contact number available for                            emergencies. The signs and symptoms of potential                            delayed complications were discussed with the                            patient. Return to normal activities tomorrow.                            Written discharge instructions were provided to the                            patient.                           - Resume previous diet.                           - Continue present medications.                           - Repeat colonoscopy in 5 years for surveillance. Milus Banister, MD 10/21/2016 9:24:22 AM This  report has been signed electronically.

## 2016-10-22 ENCOUNTER — Telehealth: Payer: Self-pay | Admitting: *Deleted

## 2016-10-22 NOTE — Telephone Encounter (Signed)
Left message on call back f/u

## 2016-11-25 DIAGNOSIS — H903 Sensorineural hearing loss, bilateral: Secondary | ICD-10-CM | POA: Diagnosis not present

## 2016-11-28 ENCOUNTER — Other Ambulatory Visit: Payer: Self-pay | Admitting: Internal Medicine

## 2016-11-28 DIAGNOSIS — E349 Endocrine disorder, unspecified: Secondary | ICD-10-CM

## 2016-11-28 NOTE — Telephone Encounter (Signed)
-   please call Androgel pump

## 2017-01-22 ENCOUNTER — Encounter: Payer: Self-pay | Admitting: Internal Medicine

## 2017-02-10 DIAGNOSIS — M109 Gout, unspecified: Secondary | ICD-10-CM | POA: Diagnosis not present

## 2017-03-11 DIAGNOSIS — H903 Sensorineural hearing loss, bilateral: Secondary | ICD-10-CM | POA: Diagnosis not present

## 2017-04-16 ENCOUNTER — Ambulatory Visit (INDEPENDENT_AMBULATORY_CARE_PROVIDER_SITE_OTHER): Payer: 59 | Admitting: Internal Medicine

## 2017-04-16 VITALS — BP 126/82 | HR 76 | Temp 97.8°F | Resp 18 | Ht 68.0 in | Wt 218.4 lb

## 2017-04-16 DIAGNOSIS — E782 Mixed hyperlipidemia: Secondary | ICD-10-CM

## 2017-04-16 DIAGNOSIS — Z111 Encounter for screening for respiratory tuberculosis: Secondary | ICD-10-CM | POA: Diagnosis not present

## 2017-04-16 DIAGNOSIS — E349 Endocrine disorder, unspecified: Secondary | ICD-10-CM

## 2017-04-16 DIAGNOSIS — Z0001 Encounter for general adult medical examination with abnormal findings: Secondary | ICD-10-CM

## 2017-04-16 DIAGNOSIS — Z79899 Other long term (current) drug therapy: Secondary | ICD-10-CM

## 2017-04-16 DIAGNOSIS — Z125 Encounter for screening for malignant neoplasm of prostate: Secondary | ICD-10-CM

## 2017-04-16 DIAGNOSIS — I10 Essential (primary) hypertension: Secondary | ICD-10-CM | POA: Diagnosis not present

## 2017-04-16 DIAGNOSIS — M1000 Idiopathic gout, unspecified site: Secondary | ICD-10-CM

## 2017-04-16 DIAGNOSIS — R7303 Prediabetes: Secondary | ICD-10-CM | POA: Diagnosis not present

## 2017-04-16 DIAGNOSIS — N390 Urinary tract infection, site not specified: Secondary | ICD-10-CM | POA: Diagnosis not present

## 2017-04-16 DIAGNOSIS — Z136 Encounter for screening for cardiovascular disorders: Secondary | ICD-10-CM

## 2017-04-16 DIAGNOSIS — R5383 Other fatigue: Secondary | ICD-10-CM

## 2017-04-16 DIAGNOSIS — E559 Vitamin D deficiency, unspecified: Secondary | ICD-10-CM

## 2017-04-16 NOTE — Progress Notes (Signed)
[place] [facility]   [name], M.D.      [name]. [name], P.A.-C [facility]                [address] [place] [address] Telephone [phone] Telefax [phone] Annual  Screening/Preventative Visit  & Comprehensive Evaluation & Examination     This very nice 56 y.o. MWM presents for a Screening/Preventative Visit & comprehensive evaluation and management of multiple medical co-morbidities.  Patient has been followed for HTN, Gout, Prediabetes, Hyperlipidemia and Vitamin D Deficiency.     In 2009, patient Underwent AP Ressection of a rectal cancer with a Colostomy and has done well since.  Colonoscopy in Feb was negative.      HTN predates since (2012). Patient's BP has been controlled at home.  Today's BP is at goal - 126/82. Patient denies any cardiac symptoms as chest pain, palpitations, shortness of breath, dizziness or ankle swelling.     Patient's hyperlipidemia is not controlled with diet and he has been reticent to filling Rx's for Lipitor. . Patient denies myalgias or other medication SE's. Last lipids were not at goal: Lab Results  Component Value Date   CHOL 179 04/16/2017   HDL 46 04/16/2017   LDLCALC 107 (H) 07/08/2016   TRIG 115 04/16/2017   CHOLHDL 3.9 04/16/2017      Patient has prediabetes (A1c 5.7% in 2012) and patient denies reactive hypoglycemic symptoms, visual blurring, diabetic polys or paresthesias. Last A1c was at goal: Lab Results  Component Value Date   HGBA1C 5.5 04/16/2017        Patient has hx/o Low Testosterone ("260" in 2012) and has declined treatment in the past.  Finally, patient has history of Vitamin D Deficiency ("14" in 2009)  and last vitamin D was at goal: Lab Results  Component Value Date   VD25OH 73 04/16/2017   Current Outpatient Prescriptions on File Prior to Visit  Medication Sig  . ANDROGEL PUMP 20.25 MG/ACT (1.62%) GEL USE 2 PUMPS  DAILY AS DIRECTED  . aspirin 81 MG tablet Take 81 mg by mouth daily.  . Cholecalciferol (VITAMIN D-3) 1000 UNITS CAPS Take 8,000 Units by mouth daily.  Marland Kitchen LOTEMAX 0.5 % GEL   . vitamin B-12 (CYANOCOBALAMIN) 500 MCG tablet Take 500 mcg by mouth daily.  Marland Kitchen atorvastatin (LIPITOR) 80 MG tablet 1 tablet daily for cholesterol (Patient not taking: Reported on 09/11/2014)  . bisoprolol-hydrochlorothiazide (ZIAC) 5-6.25 MG tablet Take 1 tablet every morning for BP   No current facility-administered medications on file prior to visit.    Allergies  Allergen Reactions  . Oxycodone Nausea And Vomiting   Past Medical History:  Diagnosis Date  . Arthritis   . Hyperlipidemia   . Hypertension   . Hypogonadism male   . Pre-diabetes   . 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 INFLUENZA VACCINE  04/01/2017  . TETANUS/TDAP  02/16/2018  . COLONOSCOPY  10/21/2021  . Hepatitis C Screening  Completed  . HIV Screening  Completed   Immunization History  Administered Date(s) Administered  . PPD Test 09/09/2013, 09/11/2014, 09/20/2015, 04/16/2017  . Tdap 02/17/2008   Past Surgical History:  Procedure Laterality Date  . COLON SURGERY N/A 2009   AP Rectal Resection  . colonoscopsy  09/2011  . HERNIA REPAIR    .  RECONSTRUCT / STABILIZE DISTAL ULNA    . WISDOM TOOTH EXTRACTION     Family History  Problem Relation Age of Onset  . COPD Mother   . Alcohol abuse Father   . Cirrhosis Father   . COPD Sister   . Alcohol abuse Brother   . Diabetes Maternal Grandmother   . Colon cancer Neg Hx    Social History   Social History  . Marital status: Married    Spouse name: N/A  . Number of children: N/A  . Years of education: N/A   Occupational History  . Not on file.   Social History Main Topics  . Smoking status: Never Smoker  . Smokeless tobacco: Never Used  . Alcohol use 2.4 oz/week    4 Glasses of wine per week     Comment: 4  . Drug use: No   . Sexual activity: Not on file   Other Topics Concern  . Not on file   Social History Narrative  . No narrative on file    ROS Constitutional: Denies fever, chills, weight loss/gain, headaches, insomnia,  night sweats or change in appetite. Does c/o fatigue. Eyes: Denies redness, blurred vision, diplopia, discharge, itchy or watery eyes.  ENT: Denies discharge, congestion, post nasal drip, epistaxis, sore throat, earache, hearing loss, dental pain, Tinnitus, Vertigo, Sinus pain or snoring.  Cardio: Denies chest pain, palpitations, irregular heartbeat, syncope, dyspnea, diaphoresis, orthopnea, PND, claudication or edema Respiratory: denies cough, dyspnea, DOE, pleurisy, hoarseness, laryngitis or wheezing.  Gastrointestinal: Denies dysphagia, heartburn, reflux, water brash, pain, cramps, nausea, vomiting, bloating, diarrhea, constipation, hematemesis, melena, hematochezia, jaundice or hemorrhoids Genitourinary: Denies dysuria, frequency, urgency, nocturia, hesitancy, discharge, hematuria or flank pain Musculoskeletal: Denies arthralgia, myalgia, stiffness, Jt. Swelling, pain, limp or strain/sprain. Denies Falls. Skin: Denies puritis, rash, hives, warts, acne, eczema or change in skin lesion Neuro: No weakness, tremor, incoordination, spasms, paresthesia or pain Psychiatric: Denies confusion, memory loss or sensory loss. Denies Depression. Endocrine: Denies change in weight, skin, hair change, nocturia, and paresthesia, diabetic polys, visual blurring or hyper / hypo glycemic episodes.  Heme/Lymph: No excessive bleeding, bruising or enlarged lymph nodes.  Physical Exam  BP 126/82   Pulse 76   Temp 97.8 F (36.6 C)   Resp 18   Ht 5\' 8"  (1.727 m)   Wt 218 lb 6.4 oz (99.1 kg)   BMI 33.21 kg/m   General Appearance: Well nourished and well groomed and in no apparent distress.  Eyes: PERRLA, EOMs, conjunctiva no swelling or erythema, normal fundi and vessels. Sinuses: No  frontal/maxillary tenderness ENT/Mouth: EACs patent / TMs  nl. Nares clear without erythema, swelling, mucoid exudates. Oral hygiene is good. No erythema, swelling, or exudate. Tongue normal, non-obstructing. Tonsils not swollen or erythematous. Hearing normal.  Neck: Supple, thyroid normal. No bruits, nodes or JVD. Respiratory: Respiratory effort normal.  BS equal and clear bilateral without rales, rhonci, wheezing or stridor. Cardio: Heart sounds are normal with regular rate and rhythm and no murmurs, rubs or gallops. Peripheral pulses are normal and equal bilaterally without edema. No aortic or femoral bruits. Chest: symmetric with normal excursions and percussion.  Abdomen: Soft, with Nl bowel sounds.  LLQ colostomy with mild lower midline diastasis. Nontender, no guarding, rebound, hernias, masses, or organomegaly.  Lymphatics: Non tender without lymphadenopathy.  Genitourinary: No hernias.Testes nl. DRE - prostate nl for age - smooth & firm w/o nodules. Musculoskeletal: Full ROM all peripheral extremities, joint stability, 5/5 strength, and normal gait. Skin: Warm and dry  without rashes, lesions, cyanosis, clubbing or  ecchymosis.  Neuro: Cranial nerves intact, reflexes equal bilaterally. Normal muscle tone, no cerebellar symptoms. Sensation intact.  Pysch: Alert and oriented X 3 with normal affect, insight and judgment appropriate.   Assessment and Plan  1. Annual Preventative/Screening Exam   2. Essential hypertension  - EKG 12-Lead - [place], RETROPERITNL ABD,  LTD - Urinalysis, Routine w reflex microscopic - Microalbumin / creatinine urine ratio - CBC with Differential/Platelet - BASIC METABOLIC PANEL WITH GFR - Magnesium - TSH  3. Hyperlipidemia, mixed  - EKG 12-Lead - [place], RETROPERITNL ABD,  LTD - Hepatic function panel - Lipid panel - TSH  4. Prediabetes  - EKG 12-Lead - [place], RETROPERITNL ABD,  LTD - Hemoglobin A1c - Insulin, random  5. Vitamin D deficiency  -  VITAMIN D 25 Hydroxy   6. Testosterone deficiency  - Testosterone  7. Idiopathic gout  - Uric acid  8. Prostate cancer screening  - PSA  9. Screening examination for pulmonary tuberculosis  - PPD  10. Fatigue  - Vitamin B12 - Iron,Total/Total Iron Binding Cap - CBC with Differential/Platelet  11. Medication management  - Urinalysis, Routine w reflex microscopic - Microalbumin / creatinine urine ratio - Testosterone - CBC with Differential/Platelet - BASIC METABOLIC PANEL WITH GFR - Hepatic function panel - Magnesium - Lipid panel - TSH - Hemoglobin A1c - Insulin, random - VITAMIN D 25 Hydroxy   12. Screening for ischemic heart disease  - EKG 12-Lead  13. Screening for AAA (aortic abdominal aneurysm)  - [place], RETROPERITNL ABD,  LTD        Patient was counseled in prudent diet, weight control to achieve/maintain BMI less than 25, BP monitoring, regular exercise and medications as discussed.  Discussed med effects and SE's. Routine screening labs and tests as requested with regular follow-up as recommended. Over 40 minutes of exam, counseling, chart review and high complex critical decision making was performed

## 2017-04-16 NOTE — Patient Instructions (Signed)

## 2017-04-17 ENCOUNTER — Encounter: Payer: Self-pay | Admitting: Internal Medicine

## 2017-04-17 LAB — MICROALBUMIN / CREATININE URINE RATIO
Creatinine, Urine: 146 mg/dL (ref 20–370)
MICROALB UR: 0.3 mg/dL
MICROALB/CREAT RATIO: 2 ug/mg{creat} (ref <30)

## 2017-04-17 LAB — INSULIN, FASTING: Insulin: 54.1 u[IU]/mL — ABNORMAL HIGH (ref 2.0–19.6)

## 2017-04-17 LAB — CBC WITH DIFFERENTIAL/PLATELET
BASOS PCT: 0.4 %
Basophils Absolute: 30 cells/uL (ref 0–200)
EOS ABS: 133 {cells}/uL (ref 15–500)
Eosinophils Relative: 1.8 %
HCT: 43.4 % (ref 38.5–50.0)
Hemoglobin: 14.6 g/dL (ref 13.2–17.1)
Lymphs Abs: 2153 cells/uL (ref 850–3900)
MCH: 28 pg (ref 27.0–33.0)
MCHC: 33.6 g/dL (ref 32.0–36.0)
MCV: 83.3 fL (ref 80.0–100.0)
MPV: 11.8 fL (ref 7.5–12.5)
Monocytes Relative: 6.6 %
NEUTROS PCT: 62.1 %
Neutro Abs: 4595 cells/uL (ref 1500–7800)
PLATELETS: 191 10*3/uL (ref 140–400)
RBC: 5.21 10*6/uL (ref 4.20–5.80)
RDW: 13.4 % (ref 11.0–15.0)
TOTAL LYMPHOCYTE: 29.1 %
WBC: 7.4 10*3/uL (ref 3.8–10.8)
WBCMIX: 488 {cells}/uL (ref 200–950)

## 2017-04-17 LAB — HEMOGLOBIN A1C
EAG (MMOL/L): 6.2 (calc)
Hgb A1c MFr Bld: 5.5 % of total Hgb (ref <5.7)
MEAN PLASMA GLUCOSE: 111 (calc)

## 2017-04-17 LAB — HEPATIC FUNCTION PANEL
AG RATIO: 2.2 (calc) (ref 1.0–2.5)
ALT: 33 U/L (ref 9–46)
AST: 18 U/L (ref 10–35)
Albumin: 4.3 g/dL (ref 3.6–5.1)
Alkaline phosphatase (APISO): 42 U/L (ref 40–115)
BILIRUBIN INDIRECT: 0.5 mg/dL (ref 0.2–1.2)
BILIRUBIN TOTAL: 0.6 mg/dL (ref 0.2–1.2)
Bilirubin, Direct: 0.1 mg/dL (ref 0.0–0.2)
GLOBULIN: 2 g/dL (ref 1.9–3.7)
TOTAL PROTEIN: 6.3 g/dL (ref 6.1–8.1)

## 2017-04-17 LAB — URINE CULTURE
MICRO NUMBER: 80889181
Result:: NO GROWTH
SPECIMEN QUALITY:: ADEQUATE

## 2017-04-17 LAB — TSH: TSH: 1.7 mIU/L (ref 0.40–4.50)

## 2017-04-17 LAB — BASIC METABOLIC PANEL WITH GFR
BUN: 15 mg/dL (ref 7–25)
CO2: 24 mmol/L (ref 20–32)
CREATININE: 1.06 mg/dL (ref 0.70–1.33)
Calcium: 9.3 mg/dL (ref 8.6–10.3)
Chloride: 105 mmol/L (ref 98–110)
GFR, EST AFRICAN AMERICAN: 90 mL/min/{1.73_m2} (ref >=60)
GFR, EST NON AFRICAN AMERICAN: 78 mL/min/{1.73_m2} (ref >=60)
GLUCOSE: 97 mg/dL (ref 65–99)
Potassium: 4.1 mmol/L (ref 3.5–5.3)
Sodium: 141 mmol/L (ref 135–146)

## 2017-04-17 LAB — IRON, TOTAL/TOTAL IRON BINDING CAP
%SAT: 21 % (ref 15–60)
IRON: 59 ug/dL (ref 50–180)
TIBC: 276 ug/dL (ref 250–425)

## 2017-04-17 LAB — URIC ACID: URIC ACID, SERUM: 8.4 mg/dL — AB (ref 4.0–8.0)

## 2017-04-17 LAB — MAGNESIUM: MAGNESIUM: 2.2 mg/dL (ref 1.5–2.5)

## 2017-04-17 LAB — LIPID PANEL
CHOL/HDL RATIO: 3.9 (calc) (ref <5.0)
CHOLESTEROL: 179 mg/dL (ref <200)
HDL: 46 mg/dL (ref >40)
LDL Cholesterol (Calc): 110 mg/dL (calc) — ABNORMAL HIGH
Non-HDL Cholesterol (Calc): 133 mg/dL (calc) — ABNORMAL HIGH (ref <130)
Triglycerides: 115 mg/dL (ref <150)

## 2017-04-17 LAB — URINALYSIS, ROUTINE W REFLEX MICROSCOPIC
Bilirubin Urine: NEGATIVE
Glucose, UA: NEGATIVE
Hgb urine dipstick: NEGATIVE
KETONES UR: NEGATIVE
LEUKOCYTES UA: NEGATIVE
NITRITE: NEGATIVE
Protein, ur: NEGATIVE
SPECIFIC GRAVITY, URINE: 1.022 (ref 1.001–1.03)
pH: 6 (ref 5.0–8.0)

## 2017-04-17 LAB — TESTOSTERONE: Testosterone: 136 ng/dL — ABNORMAL LOW (ref 250–827)

## 2017-04-17 LAB — VITAMIN B12: VITAMIN B 12: 430 pg/mL (ref 200–1100)

## 2017-04-17 LAB — VITAMIN D 25 HYDROXY (VIT D DEFICIENCY, FRACTURES): VIT D 25 HYDROXY: 73 ng/mL (ref 30–100)

## 2017-04-17 LAB — PSA: PSA: 0.3 ng/mL (ref <=4.0)

## 2017-07-14 ENCOUNTER — Other Ambulatory Visit: Payer: Self-pay | Admitting: Internal Medicine

## 2017-07-14 DIAGNOSIS — E349 Endocrine disorder, unspecified: Secondary | ICD-10-CM

## 2017-07-30 ENCOUNTER — Ambulatory Visit: Payer: Self-pay | Admitting: Adult Health

## 2017-08-14 ENCOUNTER — Ambulatory Visit: Payer: Self-pay | Admitting: Adult Health

## 2017-09-23 NOTE — Progress Notes (Signed)
FOLLOW UP  Assessment and Plan:   Hypertension Previously well controlled; he has stopped taking medication - encouraged to restart - discussed goal of 130/80 Monitor blood pressure at home; patient to call if consistently greater than 130/80 Continue DASH diet.   Reminder to go to the ER if any CP, SOB, nausea, dizziness, severe HA, changes vision/speech, left arm numbness and tingling and jaw pain. Keep close follow up in March.   Cholesterol Currently near goal;  - he has stopped taking his cholesterol medication. Will obtain cholesterol panel and evaluate baseline, but discuss will likely need to restart - can consider a lower dose if not severely elevated.  Continue low cholesterol diet and exercise.  Check lipid panel.   Prediabetes Last 2 A1Cs at goal but trending up Continue diet and exercise.  Perform daily foot/skin check, notify office of any concerning changes.  Check A1C  Obesity with co morbidities Long discussion about weight loss, diet, and exercise Recommended diet heavy in fruits and veggies and low in animal meats, cheeses, and dairy products, appropriate calorie intake Discussed ideal weight for height  Will follow up in 3 months  Vitamin D Def At goal at last visit; continue supplementation to maintain goal of 70-100 Defer Vit D level  Hypogonadism - continue replacement therapy, check testosterone levels as needed.   Colostomy [facility]) Doing well without complications.   HA/dizziness Single episode, resolved quickly- without concerning accompaniments. Please restart BP medication. Notify office of recurrent episodes. Patient to go to ER if there is weakness, thunderclap headache, visual changes, or any concerning factors.  Continue diet and meds as discussed. Further disposition pending results of labs. Discussed med's effects and SE's.   Over 30 minutes of exam, counseling, chart review, and critical decision making was performed.   Future  Appointments  Date Time Provider [place]  11/04/2017  9:30 AM [name], MD [facility] None  05/18/2018  3:00 PM [name], MD [facility] None    ----------------------------------------------------------------------------------------------------------------------  HPI 57 y.o. male  presents for 3 month follow up on hypertension, cholesterol, prediabetes, hypogonadism on testosterone supplementations, obesity and vitamin D deficiency.  He has hx of rectal cancer with resection and colostomy in 2009 - he is doing well since.   BMI is Body mass index is 33.15 kg/m., he has been working on diet and exercise. Walks daily and is pushing vegetables, cutting down on red meats and dairy.  Wt Readings from Last 3 Encounters:  09/24/17 218 lb (98.9 kg)  04/16/17 218 lb 6.4 oz (99.1 kg)  10/21/16 218 lb (98.9 kg)   He has not been checking his BP at home, stopped taking medication, today their BP is BP: (!) 146/86  He does workout - he walks 30 mins daily.  He denies chest pain, shortness of breath, dizziness.   He is on cholesterol medication but stopped taking - and denies myalgias. His cholesterol is not at goal. The cholesterol last visit was:   Lab Results  Component Value Date   CHOL 179 04/16/2017   HDL 46 04/16/2017   LDLCALC 107 (H) 07/08/2016   TRIG 115 04/16/2017   CHOLHDL 3.9 04/16/2017    He has been working on diet and exercise for prediabetes, and denies increased appetite, nausea, paresthesia of the feet, polydipsia, polyuria, visual disturbances and vomiting. Last A1C in the office was:  Lab Results  Component Value Date   HGBA1C 5.5 04/16/2017   Patient is on Vitamin D supplement and at goal at  the last check:    Lab Results  Component Value Date   VD25OH 69 04/16/2017     He has a history of testosterone deficiency and is on testosterone replacement. He states that the testosterone helps with his energy, libido, muscle mass. Lab Results   Component Value Date   TESTOSTERONE 136 (L) 04/16/2017    Current Medications:  Current Outpatient Medications on File Prior to Visit  Medication Sig  . ANDROGEL PUMP 20.25 MG/ACT (1.62%) GEL APPLY 2 PUMPS ONCE A DAY AS DIRECTED  . aspirin 81 MG tablet Take 81 mg by mouth daily.  . Cholecalciferol (VITAMIN D-3) 1000 UNITS CAPS Take 8,000 Units by mouth daily.  . vitamin B-12 (CYANOCOBALAMIN) 500 MCG tablet Take 500 mcg by mouth daily.  Marland Kitchen atorvastatin (LIPITOR) 80 MG tablet 1 tablet daily for cholesterol (Patient not taking: Reported on 09/11/2014)  . bisoprolol-hydrochlorothiazide (ZIAC) 5-6.25 MG tablet Take 1 tablet every morning for BP  . LOTEMAX 0.5 % GEL    No current facility-administered medications on file prior to visit.      Allergies:  Allergies  Allergen Reactions  . Oxycodone Nausea And Vomiting     Medical History:  Past Medical History:  Diagnosis Date  . Arthritis   . Hyperlipidemia   . Hypertension   . Hypogonadism male   . Pre-diabetes   . 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  HENT: Positive for tinnitus (Ongoing chronic, bilateral, improved with hearing aids). Negative for hearing loss.   Eyes: Negative for blurred vision and double vision.  Respiratory: Negative for cough, shortness of breath and wheezing.   Cardiovascular: Negative for chest pain, palpitations, orthopnea, claudication and leg swelling.  Gastrointestinal: Negative for abdominal pain, blood in stool, constipation, diarrhea, heartburn, melena, nausea and vomiting.  Genitourinary: Negative.   Musculoskeletal: Negative for joint pain and myalgias.  Skin: Negative for rash.  Neurological: Negative for dizziness, tingling, sensory change, weakness and headaches.       Endorses single episode of  mild headache with dizziness 3 weeks ago - resolved quickly without weakness of extremities, memory changes/confusion/difficulty speaking, other notable focal changes  Endo/Heme/Allergies: Negative for polydipsia.  Psychiatric/Behavioral: Negative.   All other systems reviewed and are negative.   Physical Exam: BP (!) 146/86   Pulse 88   Temp (!) 97.5 F (36.4 C)   Ht 5\' 8"  (1.727 m)   Wt 218 lb (98.9 kg)   SpO2 95%   BMI 33.15 kg/m  Wt Readings from Last 3 Encounters:  09/24/17 218 lb (98.9 kg)  04/16/17 218 lb 6.4 oz (99.1 kg)  10/21/16 218 lb (98.9 kg)   General Appearance: Well nourished, in no apparent distress. Eyes: PERRLA, EOMs, conjunctiva no swelling or erythema Sinuses: No Frontal/maxillary tenderness ENT/Mouth: Ext aud canals clear, TMs without erythema, bulging. No erythema, swelling, or exudate on post pharynx.  Tonsils not swollen or erythematous. Hearing normal.  Neck: Supple, thyroid normal.  Respiratory: Respiratory effort normal, BS equal bilaterally without rales, rhonchi, wheezing or stridor.  Cardio: RRR with no MRGs. Brisk peripheral pulses without edema.  Abdomen: Soft, + BS.  Non tender, no guarding, rebound, hernias, masses. Colostomy bag present to left.  Lymphatics: Non tender without lymphadenopathy.  Musculoskeletal: Full ROM, 5/5 strength, Normal gait Skin: Warm, dry without rashes, lesions, ecchymosis.  Neuro: Cranial  nerves intact. No cerebellar symptoms.  Psych: Awake and oriented X 3, normal affect, Insight and Judgment appropriate.   Izora Ribas, NP 9:02 AM [facility]

## 2017-09-24 ENCOUNTER — Encounter: Payer: Self-pay | Admitting: Adult Health

## 2017-09-24 ENCOUNTER — Ambulatory Visit: Payer: 59 | Admitting: Adult Health

## 2017-09-24 VITALS — BP 146/86 | HR 88 | Temp 97.5°F | Ht 68.0 in | Wt 218.0 lb

## 2017-09-24 DIAGNOSIS — E785 Hyperlipidemia, unspecified: Secondary | ICD-10-CM

## 2017-09-24 DIAGNOSIS — Z79899 Other long term (current) drug therapy: Secondary | ICD-10-CM | POA: Diagnosis not present

## 2017-09-24 DIAGNOSIS — E349 Endocrine disorder, unspecified: Secondary | ICD-10-CM | POA: Diagnosis not present

## 2017-09-24 DIAGNOSIS — R7303 Prediabetes: Secondary | ICD-10-CM

## 2017-09-24 DIAGNOSIS — E559 Vitamin D deficiency, unspecified: Secondary | ICD-10-CM

## 2017-09-24 DIAGNOSIS — Z933 Colostomy status: Secondary | ICD-10-CM | POA: Diagnosis not present

## 2017-09-24 DIAGNOSIS — I10 Essential (primary) hypertension: Secondary | ICD-10-CM

## 2017-09-24 NOTE — Patient Instructions (Addendum)
Please restart taking blood pressure medication - check BP randomly - goal is <130/80  Pending lab results, please restart atorvastatin - if not severely elevated may restart with 1/2 tab   Monitor your blood pressure at home. Go to the ER if any CP, SOB, nausea, dizziness, severe HA, changes vision/speech  Goal BP:  For patients younger than 60: Goal BP < 130/80. For patients 60 and older: Goal BP < 150/90. For patients with diabetes: Goal BP < 140/90. Your most recent BP: BP: (!) 146/86   Take your medications faithfully as instructed. Maintain a healthy weight. Get at least 150 minutes of aerobic exercise per week. Minimize salt intake. Minimize alcohol intake  DASH Eating Plan DASH stands for "Dietary Approaches to Stop Hypertension." The DASH eating plan is a healthy eating plan that has been shown to reduce high blood pressure (hypertension). Additional health benefits may include reducing the risk of type 2 diabetes mellitus, heart disease, and stroke. The DASH eating plan may also help with weight loss. WHAT DO I NEED TO KNOW ABOUT THE DASH EATING PLAN? For the DASH eating plan, you will follow these general guidelines:  Choose foods with a percent daily value for sodium of less than 5% (as listed on the food label).  Use salt-free seasonings or herbs instead of table salt or sea salt.  Check with your health care provider or pharmacist before using salt substitutes.  Eat lower-sodium products, often labeled as "lower sodium" or "no salt added."  Eat fresh foods.  Eat more vegetables, fruits, and low-fat dairy products.  Choose whole grains. Look for the word "whole" as the first word in the ingredient list.  Choose fish and skinless chicken or [place] more often than red meat. Limit fish, poultry, and meat to 6 oz (170 g) each day.  Limit sweets, desserts, sugars, and sugary drinks.  Choose heart-healthy fats.  Limit cheese to 1 oz (28 g) per day.  Eat more  home-cooked food and less restaurant, buffet, and fast food.  Limit fried foods.  Cook foods using methods other than frying.  Limit canned vegetables. If you do use them, rinse them well to decrease the sodium.  When eating at a restaurant, ask that your food be prepared with less salt, or no salt if possible. WHAT FOODS CAN I EAT? Seek help from a dietitian for individual calorie needs. Grains Whole grain or whole wheat bread. Brown rice. Whole grain or whole wheat pasta. Quinoa, bulgur, and whole grain cereals. Low-sodium cereals. Corn or whole wheat flour tortillas. Whole grain cornbread. Whole grain crackers. Low-sodium crackers. Vegetables Fresh or frozen vegetables (raw, steamed, roasted, or grilled). Low-sodium or reduced-sodium tomato and vegetable juices. Low-sodium or reduced-sodium tomato sauce and paste. Low-sodium or reduced-sodium canned vegetables.  Fruits All fresh, canned (in natural juice), or frozen fruits. Meat and Other Protein Products Ground beef (85% or leaner), grass-fed beef, or beef trimmed of fat. Skinless chicken or [place]. Ground chicken or [place]. Pork trimmed of fat. All fish and seafood. Eggs. Dried beans, peas, or lentils. Unsalted nuts and seeds. Unsalted canned beans. Dairy Low-fat dairy products, such as skim or 1% milk, 2% or reduced-fat cheeses, low-fat ricotta or cottage cheese, or plain low-fat yogurt. Low-sodium or reduced-sodium cheeses. Fats and Oils Tub margarines without trans fats. Light or reduced-fat mayonnaise and salad dressings (reduced sodium). Avocado. Safflower, olive, or canola oils. Natural peanut or almond butter. Other Unsalted popcorn and pretzels. The items listed above may not be a  complete list of recommended foods or beverages. Contact your dietitian for more options. WHAT FOODS ARE NOT RECOMMENDED? Grains White bread. White pasta. White rice. Refined cornbread. Bagels and croissants. Crackers that contain trans  fat. Vegetables Creamed or fried vegetables. Vegetables in a cheese sauce. Regular canned vegetables. Regular canned tomato sauce and paste. Regular tomato and vegetable juices. Fruits Dried fruits. Canned fruit in light or heavy syrup. Fruit juice. Meat and Other Protein Products Fatty cuts of meat. Ribs, chicken wings, bacon, sausage, bologna, salami, chitterlings, fatback, hot dogs, bratwurst, and packaged luncheon meats. Salted nuts and seeds. Canned beans with salt. Dairy Whole or 2% milk, cream, half-and-half, and cream cheese. Whole-fat or sweetened yogurt. Full-fat cheeses or blue cheese. Nondairy creamers and whipped toppings. Processed cheese, cheese spreads, or cheese curds. Condiments Onion and garlic salt, seasoned salt, table salt, and sea salt. Canned and packaged gravies. Worcestershire sauce. Tartar sauce. Barbecue sauce. Teriyaki sauce. Soy sauce, including reduced sodium. Steak sauce. Fish sauce. Oyster sauce. Cocktail sauce. Horseradish. Ketchup and mustard. Meat flavorings and tenderizers. Bouillon cubes. Hot sauce. Tabasco sauce. Marinades. Taco seasonings. Relishes. Fats and Oils Butter, stick margarine, lard, shortening, ghee, and bacon fat. Coconut, palm kernel, or palm oils. Regular salad dressings. Other Pickles and olives. Salted popcorn and pretzels. The items listed above may not be a complete list of foods and beverages to avoid. Contact your dietitian for more information. WHERE CAN I FIND MORE INFORMATION? National Heart, Lung, and Blood Institute: travelstabloid.com Document Released: 08/07/2011 Document Revised: 01/02/2014 Document Reviewed: 06/22/2013 [facility] Patient Information 2015 [place], [place]. This information is not intended to replace advice given to you by your health care provider. Make sure you discuss any questions you have with your health care provider.

## 2017-09-25 LAB — CBC WITH DIFFERENTIAL/PLATELET
BASOS PCT: 0.6 %
Basophils Absolute: 31 cells/uL (ref 0–200)
Eosinophils Absolute: 82 cells/uL (ref 15–500)
Eosinophils Relative: 1.6 %
HCT: 43.5 % (ref 38.5–50.0)
HEMOGLOBIN: 15.2 g/dL (ref 13.2–17.1)
Lymphs Abs: 1561 cells/uL (ref 850–3900)
MCH: 28.8 pg (ref 27.0–33.0)
MCHC: 34.9 g/dL (ref 32.0–36.0)
MCV: 82.4 fL (ref 80.0–100.0)
MONOS PCT: 5.9 %
MPV: 11.6 fL (ref 7.5–12.5)
NEUTROS ABS: 3126 {cells}/uL (ref 1500–7800)
Neutrophils Relative %: 61.3 %
PLATELETS: 186 10*3/uL (ref 140–400)
RBC: 5.28 10*6/uL (ref 4.20–5.80)
RDW: 13 % (ref 11.0–15.0)
TOTAL LYMPHOCYTE: 30.6 %
WBC: 5.1 10*3/uL (ref 3.8–10.8)
WBCMIX: 301 {cells}/uL (ref 200–950)

## 2017-09-25 LAB — BASIC METABOLIC PANEL WITH GFR
BUN: 15 mg/dL (ref 7–25)
CO2: 29 mmol/L (ref 20–32)
CREATININE: 1.1 mg/dL (ref 0.70–1.33)
Calcium: 9.6 mg/dL (ref 8.6–10.3)
Chloride: 105 mmol/L (ref 98–110)
GFR, EST NON AFRICAN AMERICAN: 74 mL/min/{1.73_m2} (ref >=60)
GFR, Est African American: 85 mL/min/{1.73_m2} (ref >=60)
GLUCOSE: 128 mg/dL — AB (ref 65–99)
Potassium: 4.8 mmol/L (ref 3.5–5.3)
Sodium: 142 mmol/L (ref 135–146)

## 2017-09-25 LAB — LIPID PANEL
Cholesterol: 191 mg/dL (ref <200)
HDL: 53 mg/dL (ref >40)
LDL CHOLESTEROL (CALC): 121 mg/dL — AB
Non-HDL Cholesterol (Calc): 138 mg/dL (calc) — ABNORMAL HIGH (ref <130)
TRIGLYCERIDES: 80 mg/dL (ref <150)
Total CHOL/HDL Ratio: 3.6 (calc) (ref <5.0)

## 2017-09-25 LAB — HEPATIC FUNCTION PANEL
AG RATIO: 2 (calc) (ref 1.0–2.5)
ALKALINE PHOSPHATASE (APISO): 40 U/L (ref 40–115)
ALT: 31 U/L (ref 9–46)
AST: 16 U/L (ref 10–35)
Albumin: 4.2 g/dL (ref 3.6–5.1)
BILIRUBIN INDIRECT: 0.6 mg/dL (ref 0.2–1.2)
BILIRUBIN TOTAL: 0.7 mg/dL (ref 0.2–1.2)
Bilirubin, Direct: 0.1 mg/dL (ref 0.0–0.2)
Globulin: 2.1 g/dL (calc) (ref 1.9–3.7)
TOTAL PROTEIN: 6.3 g/dL (ref 6.1–8.1)

## 2017-09-25 LAB — HEMOGLOBIN A1C
HEMOGLOBIN A1C: 5.6 %{Hb} (ref <5.7)
MEAN PLASMA GLUCOSE: 114 (calc)
eAG (mmol/L): 6.3 (calc)

## 2017-09-25 LAB — TESTOSTERONE: Testosterone: 176 ng/dL — ABNORMAL LOW (ref 250–827)

## 2017-09-25 LAB — TSH: TSH: 1.76 mIU/L (ref 0.40–4.50)

## 2017-10-16 ENCOUNTER — Other Ambulatory Visit: Payer: Self-pay | Admitting: Physician Assistant

## 2017-10-16 DIAGNOSIS — E349 Endocrine disorder, unspecified: Secondary | ICD-10-CM

## 2017-11-04 ENCOUNTER — Encounter: Payer: Self-pay | Admitting: Internal Medicine

## 2017-11-04 ENCOUNTER — Ambulatory Visit: Payer: Self-pay | Admitting: Internal Medicine

## 2017-12-31 ENCOUNTER — Ambulatory Visit: Payer: Self-pay | Admitting: Internal Medicine

## 2018-01-23 ENCOUNTER — Other Ambulatory Visit: Payer: Self-pay | Admitting: Physician Assistant

## 2018-01-23 DIAGNOSIS — E349 Endocrine disorder, unspecified: Secondary | ICD-10-CM

## 2018-01-28 ENCOUNTER — Ambulatory Visit: Payer: Self-pay | Admitting: Internal Medicine

## 2018-02-17 NOTE — Patient Instructions (Signed)

## 2018-02-17 NOTE — Progress Notes (Signed)
This very nice 57 y.o. MWM presents for 6 month follow up with HTN, HLD, Gout,  Pre-Diabetes and Vitamin D Deficiency. In 2009, patient nderwent AP Ressection of a rectal cancer with a Colostomy and has done well since.  Colonoscopy in Feb 2018  was negative.      Patient is treated for HTN (2012) & BP has been controlled at home & apparently he has self d/c'd his Ziac 5.  Today's BP is at goal - 130/82. Patient has had no complaints of any cardiac type chest pain, palpitations, dyspnea / orthopnea / PND, dizziness, claudication, or dependent edema.     Hyperlipidemia is not controlled with diet & he has apparently self d/c'd his Atorvastatin. Patient denied myalgias or other med SE's. Current Lipids are not at goal: Lab Results  Component Value Date   CHOL 201 (H) 02/18/2018   HDL 44 02/18/2018   LDLCALC 127 (H) 02/18/2018   TRIG 182 (H) 02/18/2018   CHOLHDL 4.6 02/18/2018      Also, the patient has history of PreDiabetes (A1c 5.7%/2012)  and has had no symptoms of reactive hypoglycemia, diabetic polys, paresthesias or visual blurring.  Current A1c is  Normal & at goal: Lab Results  Component Value Date   HGBA1C 5.5 02/18/2018      Patient has hx/o Testosterone Deficiency ("260"/2012) and has declined treatment in the past.     Further, the patient also has history of Vitamin D Deficiency ("14" /2009) and supplements vitamin D without any suspected side-effects. Current vitamin D is at goal: Lab Results  Component Value Date   VD25OH 66 02/18/2018   Current Outpatient Medications on File Prior to Visit  Medication Sig  . aspirin 81 MG tablet Take 81 mg by mouth daily.  . Cholecalciferol (VITAMIN D-3) 1000 UNITS CAPS Take 8,000 Units by mouth daily.  Marland Kitchen LOTEMAX 0.5 % GEL   . Testosterone 20.25 MG/ACT (1.62%) GEL Apply 2 pumps daily  . vitamin B-12 (CYANOCOBALAMIN) 500 MCG tablet Take 500 mcg by mouth daily.   No current facility-administered medications on file prior to visit.     Allergies  Allergen Reactions  . Oxycodone Nausea And Vomiting   PMHx:   Past Medical History:  Diagnosis Date  . Arthritis   . Hyperlipidemia   . Hypertension   . Hypogonadism male   . Pre-diabetes   . 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 PPD Test 09/09/2013, 09/11/2014, 09/20/2015, 04/16/2017  . Tdap 02/17/2008   Past Surgical History:  Procedure Laterality Date  . COLON SURGERY N/A 2009   AP Rectal Resection  . colonoscopsy  09/2011  . HERNIA REPAIR    . RECONSTRUCT / STABILIZE DISTAL ULNA    . WISDOM TOOTH EXTRACTION     FHx:    Reviewed / unchanged  SHx:    Reviewed / unchanged  Systems Review:  Constitutional: Denies fever, chills, wt changes, headaches, insomnia, fatigue, night sweats, change in appetite. Eyes: Denies redness, blurred vision, diplopia, discharge, itchy, watery eyes.  ENT: Denies discharge, congestion, post nasal drip, epistaxis, sore throat, earache, hearing loss, dental pain, tinnitus, vertigo, sinus pain, snoring.  CV: Denies chest pain, palpitations, irregular heartbeat, syncope, dyspnea, diaphoresis, orthopnea, PND, claudication or edema. Respiratory: denies cough, dyspnea, DOE, pleurisy, hoarseness, laryngitis, wheezing.  Gastrointestinal: Denies dysphagia, odynophagia, heartburn, reflux, water brash, abdominal pain or cramps, nausea, vomiting, bloating, diarrhea, constipation, hematemesis, melena, hematochezia  or  hemorrhoids. Genitourinary: Denies dysuria, frequency, urgency, nocturia, hesitancy, discharge, hematuria or flank pain. Musculoskeletal: Denies arthralgias, myalgias, stiffness, jt. swelling, pain, limping or strain/sprain.  Skin: Denies pruritus, rash, hives, warts, acne, eczema or change in skin lesion(s). Neuro: No weakness, tremor, incoordination, spasms, paresthesia or pain. Psychiatric: Denies confusion, memory loss or sensory loss. Endo: Denies change in  weight, skin or hair change.  Heme/Lymph: No excessive bleeding, bruising or enlarged lymph nodes.  Physical Exam  BP 130/82   Pulse 80   Temp (!) 97 F (36.1 C)   Resp 18   Ht 5\' 8"  (1.727 m)   Wt 219 lb 3.2 oz (99.4 kg)   BMI 33.33 kg/m   Appears  well nourished, well groomed  and in no distress.  Eyes: PERRLA, EOMs, conjunctiva no swelling or erythema. Sinuses: No frontal/maxillary tenderness ENT/Mouth: EAC's clear, TM's nl w/o erythema, bulging. Nares clear w/o erythema, swelling, exudates. Oropharynx clear without erythema or exudates. Oral hygiene is good. Tongue normal, non obstructing. Hearing intact.  Neck: Supple. Thyroid not palpable. Car 2+/2+ without bruits, nodes or JVD. Chest: Respirations nl with BS clear & equal w/o rales, rhonchi, wheezing or stridor.  Cor: Heart sounds normal w/ regular rate and rhythm without sig. murmurs, gallops, clicks or rubs. Peripheral pulses normal and equal  without edema.  Abdomen: Soft & bowel sounds normal. Non-tender w/o guarding, rebound, hernias, masses. Lymphatics: Unremarkable.  Musculoskeletal: Full ROM all peripheral extremities, joint stability, 5/5 strength and normal gait.  Skin: Warm, dry without exposed rashes, lesions or ecchymosis apparent.  Neuro: Cranial nerves intact, reflexes equal bilaterally. Sensory-motor testing grossly intact. Tendon reflexes grossly intact.  Pysch: Alert & oriented x 3.  Insight and judgement nl & appropriate. No ideations.  Assessment and Plan:  1. Essential hypertension  - Continue medication, monitor blood pressure at home.  - Continue DASH diet.  Reminder to go to the ER if any CP,  SOB, nausea, dizziness, severe HA, changes vision/speech.  - CBC with Differential/Platelet - COMPLETE METABOLIC PANEL WITH GFR - Magnesium - TSH  2. Hyperlipidemia, mixed  - Continue diet/meds, exercise,& lifestyle modifications.  - Continue monitor periodic cholesterol/liver & renal functions   -  Lipid panel - TSH - rosuvastatin (CRESTOR) 40 MG tablet; Take 1/2 to 1 tablet daily or as directed for Cholesterol  Dispense: 90 tablet; Refill: 1  3. Prediabetes  - Continue diet, exercise, lifestyle modifications.  - Monitor appropriate labs.  - Hemoglobin A1c - Insulin, random  4. Vitamin D deficiency  - Continue supplementation.  - VITAMIN D 25 Hydroxyl  5. Testosterone deficiency  6. Idiopathic gout  - Uric acid  7. Arthralgia, unspecified joint  - Sedimentation rate  8. Medication management  - CBC with Differential/Platelet - COMPLETE METABOLIC PANEL WITH GFR - Magnesium - Lipid panel - TSH - Hemoglobin A1c - Insulin, random - VITAMIN D 25 Hydroxy (Vit-D Deficiency, Fractures) - Uric acid  9. Erectile dysfunction, unspecified erectile dysfunction type  - sildenafil (VIAGRA) 100 MG tablet; Take 1/2 to 1 tablet daily as needed for XXXX -  Dispense: 30 tablet; Refill: 99           Discussed  regular exercise, BP monitoring, weight control to achieve/maintain BMI less than 25 and discussed med and SE's. Recommended labs to assess and monitor clinical status with further disposition pending results of labs. Over 30 minutes of exam, counseling, chart review was performed.

## 2018-02-18 ENCOUNTER — Ambulatory Visit: Payer: 59 | Admitting: Internal Medicine

## 2018-02-18 ENCOUNTER — Encounter: Payer: Self-pay | Admitting: Internal Medicine

## 2018-02-18 VITALS — BP 130/82 | HR 80 | Temp 97.0°F | Resp 18 | Ht 68.0 in | Wt 219.2 lb

## 2018-02-18 DIAGNOSIS — Z79899 Other long term (current) drug therapy: Secondary | ICD-10-CM | POA: Diagnosis not present

## 2018-02-18 DIAGNOSIS — I10 Essential (primary) hypertension: Secondary | ICD-10-CM

## 2018-02-18 DIAGNOSIS — N529 Male erectile dysfunction, unspecified: Secondary | ICD-10-CM | POA: Diagnosis not present

## 2018-02-18 DIAGNOSIS — E559 Vitamin D deficiency, unspecified: Secondary | ICD-10-CM

## 2018-02-18 DIAGNOSIS — R7303 Prediabetes: Secondary | ICD-10-CM

## 2018-02-18 DIAGNOSIS — E782 Mixed hyperlipidemia: Secondary | ICD-10-CM | POA: Diagnosis not present

## 2018-02-18 DIAGNOSIS — E349 Endocrine disorder, unspecified: Secondary | ICD-10-CM | POA: Diagnosis not present

## 2018-02-18 DIAGNOSIS — M2550 Pain in unspecified joint: Secondary | ICD-10-CM | POA: Diagnosis not present

## 2018-02-18 DIAGNOSIS — M1000 Idiopathic gout, unspecified site: Secondary | ICD-10-CM | POA: Diagnosis not present

## 2018-02-18 MED ORDER — SILDENAFIL CITRATE 100 MG PO TABS: ORAL_TABLET | ORAL | 99 refills | Status: DC

## 2018-02-18 MED ORDER — ROSUVASTATIN CALCIUM 40 MG PO TABS
ORAL_TABLET | ORAL | 1 refills | Status: DC
Start: 2018-02-18 — End: 2019-05-24

## 2018-02-19 LAB — COMPLETE METABOLIC PANEL WITH GFR
AG Ratio: 1.8 (calc) (ref 1.0–2.5)
ALT: 34 U/L (ref 9–46)
AST: 18 U/L (ref 10–35)
Albumin: 4.2 g/dL (ref 3.6–5.1)
Alkaline phosphatase (APISO): 45 U/L (ref 40–115)
BUN: 17 mg/dL (ref 7–25)
CALCIUM: 9.7 mg/dL (ref 8.6–10.3)
CO2: 28 mmol/L (ref 20–32)
CREATININE: 1.08 mg/dL (ref 0.70–1.33)
Chloride: 103 mmol/L (ref 98–110)
GFR, EST NON AFRICAN AMERICAN: 75 mL/min/{1.73_m2} (ref >=60)
GFR, Est African American: 87 mL/min/{1.73_m2} (ref >=60)
GLOBULIN: 2.3 g/dL (ref 1.9–3.7)
Glucose, Bld: 106 mg/dL — ABNORMAL HIGH (ref 65–99)
Potassium: 4.2 mmol/L (ref 3.5–5.3)
Sodium: 139 mmol/L (ref 135–146)
TOTAL PROTEIN: 6.5 g/dL (ref 6.1–8.1)
Total Bilirubin: 0.6 mg/dL (ref 0.2–1.2)

## 2018-02-19 LAB — HEMOGLOBIN A1C
Hgb A1c MFr Bld: 5.5 % of total Hgb (ref <5.7)
Mean Plasma Glucose: 111 (calc)
eAG (mmol/L): 6.2 (calc)

## 2018-02-19 LAB — CBC WITH DIFFERENTIAL/PLATELET
BASOS PCT: 0.4 %
Basophils Absolute: 27 cells/uL (ref 0–200)
EOS PCT: 2.1 %
Eosinophils Absolute: 143 cells/uL (ref 15–500)
HCT: 43.9 % (ref 38.5–50.0)
Hemoglobin: 15.3 g/dL (ref 13.2–17.1)
Lymphs Abs: 1986 cells/uL (ref 850–3900)
MCH: 28.1 pg (ref 27.0–33.0)
MCHC: 34.9 g/dL (ref 32.0–36.0)
MCV: 80.6 fL (ref 80.0–100.0)
MONOS PCT: 6.6 %
MPV: 12.1 fL (ref 7.5–12.5)
NEUTROS PCT: 61.7 %
Neutro Abs: 4196 cells/uL (ref 1500–7800)
PLATELETS: 194 10*3/uL (ref 140–400)
RBC: 5.45 10*6/uL (ref 4.20–5.80)
RDW: 13.1 % (ref 11.0–15.0)
TOTAL LYMPHOCYTE: 29.2 %
WBC mixed population: 449 cells/uL (ref 200–950)
WBC: 6.8 10*3/uL (ref 3.8–10.8)

## 2018-02-19 LAB — LIPID PANEL
CHOL/HDL RATIO: 4.6 (calc) (ref <5.0)
Cholesterol: 201 mg/dL — ABNORMAL HIGH (ref <200)
HDL: 44 mg/dL (ref >40)
LDL CHOLESTEROL (CALC): 127 mg/dL — AB
Non-HDL Cholesterol (Calc): 157 mg/dL (calc) — ABNORMAL HIGH (ref <130)
Triglycerides: 182 mg/dL — ABNORMAL HIGH (ref <150)

## 2018-02-19 LAB — SEDIMENTATION RATE: Sed Rate: 6 mm/h (ref 0–20)

## 2018-02-19 LAB — TSH: TSH: 1.85 m[IU]/L (ref 0.40–4.50)

## 2018-02-19 LAB — VITAMIN D 25 HYDROXY (VIT D DEFICIENCY, FRACTURES): Vit D, 25-Hydroxy: 66 ng/mL (ref 30–100)

## 2018-02-19 LAB — MAGNESIUM: Magnesium: 2.2 mg/dL (ref 1.5–2.5)

## 2018-02-19 LAB — URIC ACID: Uric Acid, Serum: 8.2 mg/dL — ABNORMAL HIGH (ref 4.0–8.0)

## 2018-02-19 LAB — INSULIN, RANDOM: Insulin: 63.7 u[IU]/mL — ABNORMAL HIGH (ref 2.0–19.6)

## 2018-05-18 ENCOUNTER — Encounter: Payer: Self-pay | Admitting: Internal Medicine

## 2018-05-18 ENCOUNTER — Ambulatory Visit: Payer: 59 | Admitting: Internal Medicine

## 2018-05-18 VITALS — BP 124/84 | HR 84 | Temp 97.6°F | Resp 16 | Ht 68.0 in | Wt 220.4 lb

## 2018-05-18 DIAGNOSIS — Z0001 Encounter for general adult medical examination with abnormal findings: Secondary | ICD-10-CM

## 2018-05-18 DIAGNOSIS — Z136 Encounter for screening for cardiovascular disorders: Secondary | ICD-10-CM

## 2018-05-18 DIAGNOSIS — M1000 Idiopathic gout, unspecified site: Secondary | ICD-10-CM

## 2018-05-18 DIAGNOSIS — I10 Essential (primary) hypertension: Secondary | ICD-10-CM | POA: Diagnosis not present

## 2018-05-18 DIAGNOSIS — Z79899 Other long term (current) drug therapy: Secondary | ICD-10-CM

## 2018-05-18 DIAGNOSIS — Z0000 Encounter for general adult medical examination without abnormal findings: Secondary | ICD-10-CM | POA: Diagnosis not present

## 2018-05-18 DIAGNOSIS — Z1211 Encounter for screening for malignant neoplasm of colon: Secondary | ICD-10-CM

## 2018-05-18 DIAGNOSIS — R140 Abdominal distension (gaseous): Secondary | ICD-10-CM

## 2018-05-18 DIAGNOSIS — E349 Endocrine disorder, unspecified: Secondary | ICD-10-CM

## 2018-05-18 DIAGNOSIS — E559 Vitamin D deficiency, unspecified: Secondary | ICD-10-CM

## 2018-05-18 DIAGNOSIS — E782 Mixed hyperlipidemia: Secondary | ICD-10-CM

## 2018-05-18 DIAGNOSIS — Z111 Encounter for screening for respiratory tuberculosis: Secondary | ICD-10-CM | POA: Diagnosis not present

## 2018-05-18 DIAGNOSIS — R7303 Prediabetes: Secondary | ICD-10-CM

## 2018-05-18 DIAGNOSIS — R5383 Other fatigue: Secondary | ICD-10-CM

## 2018-05-18 DIAGNOSIS — Z125 Encounter for screening for malignant neoplasm of prostate: Secondary | ICD-10-CM

## 2018-05-18 DIAGNOSIS — R1084 Generalized abdominal pain: Secondary | ICD-10-CM

## 2018-05-18 NOTE — Patient Instructions (Signed)

## 2018-05-18 NOTE — Progress Notes (Signed)
[facility]   [name], M.D.     [name]. [name], P.A.-C Liane Comber, [place]                [address] [place], [place] [address] Telephone [phone] Telefax [phone] Annual  Screening/Preventative Visit  & Comprehensive Evaluation & Examination     This very nice 57 y.o. MWM presents for a Screening /Preventative Visit & comprehensive evaluation and management of multiple medical co-morbidities.  Patient has been followed for HTN, HLD, Prediabetes and Vitamin D Deficiency.Patient has hx/o Gout in the past & currently is on no maintenance meds.      Patient had resection of a Villous Adenoma by colonoscopy in 2006.   Patient had AP Ressection of a rectal cancer with a Colostomy in 2009 and has done well since w/colostomy care.  Last Colonoscopy in Feb 2018 was negative & Dr Ardis Hughs recommended 5 yr f/u in Feb 2023. He does report recent generalized abdominal pains & pc bloating  - location is more Rt sided - No assoc N/V or blood in BM's.      HTN predates circa 2012. Patient's BP has been controlled at home.  Today's BP: 124/84. Patient denies any cardiac symptoms as chest pain, palpitations, shortness of breath, dizziness or ankle swelling.     Patient's hyperlipidemia is not controlled with diet and Lipitor was switched to Crestor this last year. Patient denies myalgias or other medication SE's. Last lipids were not at goal: Lab Results  Component Value Date   CHOL 201 (H) 02/18/2018   HDL 44 02/18/2018   LDLCALC 127 (H) 02/18/2018   TRIG 182 (H) 02/18/2018   CHOLHDL 4.6 02/18/2018      Patient has hx/o Morbid Obesity (BMI 33+) and prediabetes  (A1c 5.7%/2012)  and patient denies reactive hypoglycemic symptoms, visual blurring, diabetic polys or paresthesias. Last A1c was Normal & at goal: Lab Results  Component Value Date   HGBA1C 5.5 02/18/2018       Patient has hx/o  Low T ("260"/2012) and has deferred treatment in the past.      Finally, patient has history of Vitamin D Deficiency ("14"/2009) and last vitamin D was at goal; Lab Results  Component Value Date   VD25OH 66 02/18/2018   Current Outpatient Medications on File Prior to Visit  Medication Sig  . aspirin 81 MG tablet Take 81 mg by mouth daily.  . Cholecalciferol (VITAMIN D-3) 1000 UNITS CAPS Take 8,000 Units by mouth daily.  Marland Kitchen LOTEMAX 0.5 % GEL   . rosuvastatin (CRESTOR) 40 MG tablet Take 1/2 to 1 tablet daily or as directed for Cholesterol  . sildenafil (VIAGRA) 100 MG tablet Take 1/2 to 1 tablet daily as needed for XXXX  . Testosterone 20.25 MG/ACT (1.62%) GEL Apply 2 pumps daily  . vitamin B-12 (CYANOCOBALAMIN) 500 MCG tablet Take 500 mcg by mouth daily.   No current facility-administered medications on file prior to visit.    Allergies  Allergen Reactions  . Oxycodone Nausea And Vomiting   Past Medical History:  Diagnosis Date  . Arthritis   . Hyperlipidemia   . Hypertension   . Hypogonadism male   . Pre-diabetes   . 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 INFLUENZA VACCINE  04/02/2019 (Originally 04/01/2018)  . TETANUS/TDAP  05/19/2019 (Originally 02/16/2018)  . COLONOSCOPY  10/21/2021  . Hepatitis C Screening  Completed  . HIV Screening  Completed   Immunization History  Administered Date(s) Administered  . PPD Test 09/09/2013, 09/11/2014, 09/20/2015, 04/16/2017, 05/18/2018  . Tdap 02/17/2008   Last Colon - 10/21/2016 - Dr Ardis Hughs - 5 yr  F/u due 10/2021.  Past Surgical History:  Procedure Laterality Date  . COLON SURGERY N/A 2009   AP Rectal Resection  . colonoscopsy  09/2011  . HERNIA REPAIR    . RECONSTRUCT / STABILIZE DISTAL ULNA    . WISDOM TOOTH EXTRACTION     Family History  Problem Relation Age of Onset  . COPD Mother   . Alcohol abuse Father   . Cirrhosis Father   . COPD Sister   . Alcohol abuse Brother    . Diabetes Maternal Grandmother   . Colon cancer Neg Hx    Social History   Socioeconomic History  . Marital status: Married    Spouse name: Cecille Rubin  . Number of children: 3 sons - 2 Gc  Occupational History  . Sales  Tobacco Use  . Smoking status: Never Smoker  . Smokeless tobacco: Never Used  Substance and Sexual Activity  . Alcohol use: Yes    Alcohol/week: 4.0 standard drinks    Types: 4 Glasses of wine per week    Comment: 4  . Drug use: No  . Sexual activity: Not on file    ROS Constitutional: Denies fever, chills, weight loss/gain, headaches, insomnia,  night sweats or change in appetite. Does c/o fatigue. Eyes: Denies redness, blurred vision, diplopia, discharge, itchy or watery eyes.  ENT: Denies discharge, congestion, post nasal drip, epistaxis, sore throat, earache, hearing loss, dental pain, Tinnitus, Vertigo, Sinus pain or snoring.  Cardio: Denies chest pain, palpitations, irregular heartbeat, syncope, dyspnea, diaphoresis, orthopnea, PND, claudication or edema Respiratory: denies cough, dyspnea, DOE, pleurisy, hoarseness, laryngitis or wheezing.  Gastrointestinal: Denies dysphagia, heartburn, reflux, water brash, pain, cramps, nausea, vomiting, bloating, diarrhea, constipation, hematemesis, melena, hematochezia, jaundice or hemorrhoids Genitourinary: Denies dysuria, frequency, urgency, nocturia, hesitancy, discharge, hematuria or flank pain Musculoskeletal: Denies arthralgia, myalgia, stiffness, Jt. Swelling, pain, limp or strain/sprain. Denies Falls. Skin: Denies puritis, rash, hives, warts, acne, eczema or change in skin lesion Neuro: No weakness, tremor, incoordination, spasms, paresthesia or pain Psychiatric: Denies confusion, memory loss or sensory loss. Denies Depression. Endocrine: Denies change in weight, skin, hair change, nocturia, and paresthesia, diabetic polys, visual blurring or hyper / hypo glycemic episodes.  Heme/Lymph: No excessive bleeding, bruising  or enlarged lymph nodes.  Physical Exam  BP 124/84   Pulse 84   Temp 97.6 F (36.4 C)   Resp 16   Ht 5\' 8"  (1.727 m)   Wt 220 lb 6.4 oz (100 kg)   BMI 33.51 kg/m   General Appearance: Well nourished and well groomed and in no apparent distress.  Eyes: PERRLA, EOMs, conjunctiva no swelling or erythema, normal fundi and vessels. Sinuses: No frontal/maxillary tenderness ENT/Mouth: EACs patent / TMs  nl. Nares clear without erythema, swelling, mucoid exudates. Oral hygiene is good. No erythema, swelling, or exudate. Tongue normal, non-obstructing. Tonsils not swollen or erythematous. Hearing normal.  Neck: Supple, thyroid not palpable. No bruits, nodes or JVD. Respiratory: Respiratory effort normal.  BS equal and clear bilateral without rales, rhonci, wheezing or stridor. Cardio: Heart sounds are normal with regular rate and rhythm and no murmurs, rubs or gallops. Peripheral pulses are normal and equal bilaterally without edema. No aortic  or femoral bruits. Chest: symmetric with normal excursions and percussion.  Abdomen: Soft, with Nl bowel sounds. Nontender, no guarding, rebound, hernias, masses, or organomegaly.  Lymphatics: Non tender without lymphadenopathy.  Genitourinary: No hernias.Testes nl. DRE - prostate nl for age - smooth & firm w/o nodules. Musculoskeletal: Full ROM all peripheral extremities, joint stability, 5/5 strength, and normal gait. Skin: Warm and dry without rashes, lesions, cyanosis, clubbing or  ecchymosis.  Neuro: Cranial nerves intact, reflexes equal bilaterally. Normal muscle tone, no cerebellar symptoms. Sensation intact.  Pysch: Alert and oriented X 3 with normal affect, insight and judgment appropriate.   Assessment and Plan  1. Annual Preventative/Screening Exam   2. Essential hypertension  - EKG 12-Lead - Urinalysis, Routine w reflex microscopic - Microalbumin / creatinine urine ratio - CBC with Differential/Platelet - COMPLETE METABOLIC PANEL  WITH GFR - Magnesium - TSH  3. Hyperlipidemia, mixed  - EKG 12-Lead - Lipid panel - TSH  4. Prediabetes  - EKG 12-Lead - Hemoglobin A1c - Insulin, random  5. Vitamin D deficiency  - EKG 12-Lead - VITAMIN D 25 Hydroxyl  6. Testosterone deficiency  - Testosterone  7. Idiopathic gout  - Uric acid  8. Colon cancer screening   9. Prostate cancer screening  - POC Hemoccult Bld/Stl - PSA  10. Screening examination for pulmonary tuberculosis  - TB Skin Test  11. Screening for ischemic heart disease   12. Screening for AAA (aortic abdominal aneurysm)  - EKG 12-Lead  13. Fatigue, unspecified type  - Iron,Total/Total Iron Binding Cap - Vitamin B12 - CBC with Differential/Platelet  14. Medication management  - [place], RETROPERITNL ABD,  LTD - Urinalysis, Routine w reflex microscopic - CBC with Differential/Platelet - COMPLETE METABOLIC PANEL WITH GFR - Magnesium - Lipid panel - TSH - TB Skin Test - Hemoglobin A1c - Insulin, random - VITAMIN D 25 Hydroxyl - Uric acid  15. Abdominal distension (gaseous)  - CT Abdomen Pelvis Wo Contrast  16. Generalized abdominal pain  - CT Abdomen Pelvis Wo Contrast;         Patient was counseled in prudent diet, weight control to achieve/maintain BMI less than 25, BP monitoring, regular exercise and medications as discussed.  Discussed med effects and SE's. Routine screening labs and tests as requested with regular follow-up as recommended. Over 40 minutes of exam, counseling, chart review and high complex critical decision making was performed

## 2018-05-19 LAB — CBC WITH DIFFERENTIAL/PLATELET
Basophils Absolute: 19 cells/uL (ref 0–200)
Basophils Relative: 0.3 %
EOS ABS: 141 {cells}/uL (ref 15–500)
Eosinophils Relative: 2.2 %
HCT: 43.7 % (ref 38.5–50.0)
Hemoglobin: 14.8 g/dL (ref 13.2–17.1)
Lymphs Abs: 2106 cells/uL (ref 850–3900)
MCH: 27.9 pg (ref 27.0–33.0)
MCHC: 33.9 g/dL (ref 32.0–36.0)
MCV: 82.5 fL (ref 80.0–100.0)
MONOS PCT: 4.7 %
MPV: 11.4 fL (ref 7.5–12.5)
NEUTROS PCT: 59.9 %
Neutro Abs: 3834 cells/uL (ref 1500–7800)
PLATELETS: 197 10*3/uL (ref 140–400)
RBC: 5.3 10*6/uL (ref 4.20–5.80)
RDW: 13.5 % (ref 11.0–15.0)
TOTAL LYMPHOCYTE: 32.9 %
WBC: 6.4 10*3/uL (ref 3.8–10.8)
WBCMIX: 301 {cells}/uL (ref 200–950)

## 2018-05-19 LAB — LIPID PANEL
Cholesterol: 147 mg/dL (ref <200)
HDL: 43 mg/dL (ref >40)
LDL Cholesterol (Calc): 72 mg/dL (calc)
NON-HDL CHOLESTEROL (CALC): 104 mg/dL (ref <130)
Total CHOL/HDL Ratio: 3.4 (calc) (ref <5.0)
Triglycerides: 220 mg/dL — ABNORMAL HIGH (ref <150)

## 2018-05-19 LAB — MAGNESIUM: Magnesium: 2 mg/dL (ref 1.5–2.5)

## 2018-05-19 LAB — COMPLETE METABOLIC PANEL WITH GFR
AG Ratio: 2 (calc) (ref 1.0–2.5)
ALBUMIN MSPROF: 4.3 g/dL (ref 3.6–5.1)
ALKALINE PHOSPHATASE (APISO): 44 U/L (ref 40–115)
ALT: 29 U/L (ref 9–46)
AST: 19 U/L (ref 10–35)
BUN: 17 mg/dL (ref 7–25)
CALCIUM: 9.6 mg/dL (ref 8.6–10.3)
CO2: 28 mmol/L (ref 20–32)
Chloride: 102 mmol/L (ref 98–110)
Creat: 1.21 mg/dL (ref 0.70–1.33)
GFR, EST AFRICAN AMERICAN: 76 mL/min/{1.73_m2} (ref >=60)
GFR, Est Non African American: 66 mL/min/{1.73_m2} (ref >=60)
GLUCOSE: 127 mg/dL — AB (ref 65–99)
Globulin: 2.1 g/dL (calc) (ref 1.9–3.7)
POTASSIUM: 4.3 mmol/L (ref 3.5–5.3)
SODIUM: 138 mmol/L (ref 135–146)
Total Bilirubin: 0.7 mg/dL (ref 0.2–1.2)
Total Protein: 6.4 g/dL (ref 6.1–8.1)

## 2018-05-19 LAB — HEMOGLOBIN A1C
HEMOGLOBIN A1C: 5.6 %{Hb} (ref <5.7)
Mean Plasma Glucose: 114 (calc)
eAG (mmol/L): 6.3 (calc)

## 2018-05-19 LAB — URINALYSIS, ROUTINE W REFLEX MICROSCOPIC
BILIRUBIN URINE: NEGATIVE
Glucose, UA: NEGATIVE
HGB URINE DIPSTICK: NEGATIVE
KETONES UR: NEGATIVE
Leukocytes, UA: NEGATIVE
NITRITE: NEGATIVE
PH: 6 (ref 5.0–8.0)
PROTEIN: NEGATIVE
SPECIFIC GRAVITY, URINE: 1.011 (ref 1.001–1.03)

## 2018-05-19 LAB — MICROALBUMIN / CREATININE URINE RATIO
CREATININE, URINE: 65 mg/dL (ref 20–320)
Microalb, Ur: <0.2 mg/dL

## 2018-05-19 LAB — IRON, TOTAL/TOTAL IRON BINDING CAP
%SAT: 22 % (calc) (ref 20–48)
IRON: 60 ug/dL (ref 50–180)
TIBC: 267 ug/dL (ref 250–425)

## 2018-05-19 LAB — URIC ACID: URIC ACID, SERUM: 7.9 mg/dL (ref 4.0–8.0)

## 2018-05-19 LAB — VITAMIN B12: VITAMIN B 12: 894 pg/mL (ref 200–1100)

## 2018-05-19 LAB — INSULIN, RANDOM: Insulin: 83.3 u[IU]/mL — ABNORMAL HIGH (ref 2.0–19.6)

## 2018-05-19 LAB — VITAMIN D 25 HYDROXY (VIT D DEFICIENCY, FRACTURES): Vit D, 25-Hydroxy: 81 ng/mL (ref 30–100)

## 2018-05-19 LAB — TESTOSTERONE: Testosterone: 161 ng/dL — ABNORMAL LOW (ref 250–827)

## 2018-05-19 LAB — PSA: PSA: 0.3 ng/mL (ref <=4.0)

## 2018-05-19 LAB — TSH: TSH: 1.9 mIU/L (ref 0.40–4.50)

## 2018-05-26 ENCOUNTER — Ambulatory Visit
Admission: RE | Admit: 2018-05-26 | Discharge: 2018-05-26 | Disposition: A | Discharge status: Home / Self Care | Payer: 59 | Source: Ambulatory Visit | Attending: Internal Medicine | Admitting: Internal Medicine

## 2018-05-26 DIAGNOSIS — R1084 Generalized abdominal pain: Secondary | ICD-10-CM

## 2018-05-26 DIAGNOSIS — R109 Unspecified abdominal pain: Secondary | ICD-10-CM | POA: Diagnosis not present

## 2018-05-26 DIAGNOSIS — R140 Abdominal distension (gaseous): Secondary | ICD-10-CM

## 2018-05-26 MED ORDER — IOPAMIDOL (ISOVUE-300) INJECTION 61%
100.0000 mL | Freq: Once | INTRAVENOUS | Status: AC | PRN
  Administered 2018-05-26: 100 mL via INTRAVENOUS

## 2018-08-27 ENCOUNTER — Ambulatory Visit: Payer: Self-pay | Admitting: Adult Health

## 2018-09-03 NOTE — Progress Notes (Deleted)
FOLLOW UP  Assessment and Plan:   Hypertension Previously well controlled; he has stopped taking medication - encouraged to restart - discussed goal of 130/80 Monitor blood pressure at home; patient to call if consistently greater than 130/80 Continue DASH diet.   Reminder to go to the ER if any CP, SOB, nausea, dizziness, severe HA, changes vision/speech, left arm numbness and tingling and jaw pain. Keep close follow up in March.   Cholesterol Currently near goal; continue rosuvastatin *** Continue low cholesterol diet and exercise.  Check lipid panel.   Prediabetes Last several A1Cs at goal Continue diet and exercise.  Perform daily foot/skin check, notify office of any concerning changes.  Defer A1C; check CMP/GFR  Obesity with co morbidities Long discussion about weight loss, diet, and exercise Recommended diet heavy in fruits and veggies and low in animal meats, cheeses, and dairy products, appropriate calorie intake Discussed ideal weight for height  Will follow up in 3 months  Vitamin D Def At goal at last visit; continue supplementation to maintain goal of 70-100 Defer Vit D level  Hypogonadism - continue replacement therapy, check testosterone levels as needed.   Colostomy [facility]) Doing well without complications.   Continue diet and meds as discussed. Further disposition pending results of labs. Discussed med's effects and SE's.   Over 30 minutes of exam, counseling, chart review, and critical decision making was performed.   Future Appointments  Date Time Provider [place]  09/06/2018  8:45 AM Liane Comber, NP [facility] None  11/29/2018  9:30 AM [name], MD [facility] None  06/23/2019  3:00 PM [name], MD [facility] None    ----------------------------------------------------------------------------------------------------------------------  HPI 58 y.o. male  presents for 3 month follow up on hypertension, cholesterol,  prediabetes, hypogonadism on testosterone supplementations, obesity and vitamin D deficiency.  He has hx of rectal cancer with resection and colostomy in 2009 - he is doing well since.   BMI is There is no height or weight on file to calculate BMI., he has been working on diet and exercise. Walks daily and is pushing vegetables, cutting down on red meats and dairy.  Wt Readings from Last 3 Encounters:  05/18/18 220 lb 6.4 oz (100 kg)  02/18/18 219 lb 3.2 oz (99.4 kg)  09/24/17 218 lb (98.9 kg)   He has not been checking his BP at home, stopped taking medication, today their BP is    He does workout - he walks 30 mins daily.  He denies chest pain, shortness of breath, dizziness.   He is on cholesterol medication - rosuvastatin 40 mg *** - and denies myalgias. His LDL cholesterol is at goal. The cholesterol last visit was:   Lab Results  Component Value Date   CHOL 147 05/18/2018   HDL 43 05/18/2018   LDLCALC 72 05/18/2018   TRIG 220 (H) 05/18/2018   CHOLHDL 3.4 05/18/2018    He has been working on diet and exercise for prediabetes, and denies increased appetite, nausea, paresthesia of the feet, polydipsia, polyuria, visual disturbances and vomiting. Last A1C in the office was:  Lab Results  Component Value Date   HGBA1C 5.6 05/18/2018   Patient is on Vitamin D supplement and at goal at the last check:    Lab Results  Component Value Date   VD25OH 81 05/18/2018     He has a history of testosterone deficiency and is on testosterone replacement. He states that the testosterone helps with his energy, libido, muscle mass. Lab Results  Component  Value Date   TESTOSTERONE 161 (L) 05/18/2018   Patient has dx of gout but not on allopurinol and {Action; does/does not:19097} report a recent flare.  Lab Results  Component Value Date   LABURIC 7.9 05/18/2018    Current Medications:  Current Outpatient Medications on File Prior to Visit  Medication Sig  . aspirin 81 MG tablet Take 81 mg  by mouth daily.  . Cholecalciferol (VITAMIN D-3) 1000 UNITS CAPS Take 8,000 Units by mouth daily.  Marland Kitchen LOTEMAX 0.5 % GEL   . rosuvastatin (CRESTOR) 40 MG tablet Take 1/2 to 1 tablet daily or as directed for Cholesterol  . sildenafil (VIAGRA) 100 MG tablet Take 1/2 to 1 tablet daily as needed for XXXX  . Testosterone 20.25 MG/ACT (1.62%) GEL Apply 2 pumps daily  . vitamin B-12 (CYANOCOBALAMIN) 500 MCG tablet Take 500 mcg by mouth daily.   No current facility-administered medications on file prior to visit.      Allergies:  Allergies  Allergen Reactions  . Oxycodone Nausea And Vomiting     Medical History:  Past Medical History:  Diagnosis Date  . Arthritis   . Hyperlipidemia   . Hypertension   . Hypogonadism male   . Pre-diabetes   . 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  HENT: Positive for tinnitus (Ongoing chronic, bilateral, improved with hearing aids). Negative for hearing loss.   Eyes: Negative for blurred vision and double vision.  Respiratory: Negative for cough, shortness of breath and wheezing.   Cardiovascular: Negative for chest pain, palpitations, orthopnea, claudication and leg swelling.  Gastrointestinal: Negative for abdominal pain, blood in stool, constipation, diarrhea, heartburn, melena, nausea and vomiting.  Genitourinary: Negative.   Musculoskeletal: Negative for joint pain and myalgias.  Skin: Negative for rash.  Neurological: Negative for dizziness, tingling, sensory change, weakness and headaches.  Endo/Heme/Allergies: Negative for polydipsia.  Psychiatric/Behavioral: Negative.   All other systems reviewed and are negative.   Physical Exam: There were no vitals taken for this visit. Wt Readings from Last 3 Encounters:  05/18/18 220 lb 6.4 oz (100 kg)   02/18/18 219 lb 3.2 oz (99.4 kg)  09/24/17 218 lb (98.9 kg)   General Appearance: Well nourished, in no apparent distress. Eyes: PERRLA, EOMs, conjunctiva no swelling or erythema Sinuses: No Frontal/maxillary tenderness ENT/Mouth: Ext aud canals clear, TMs without erythema, bulging. No erythema, swelling, or exudate on post pharynx.  Tonsils not swollen or erythematous. Hearing normal.  Neck: Supple, thyroid normal.  Respiratory: Respiratory effort normal, BS equal bilaterally without rales, rhonchi, wheezing or stridor.  Cardio: RRR with no MRGs. Brisk peripheral pulses without edema.  Abdomen: Soft, + BS.  Non tender, no guarding, rebound, hernias, masses. Colostomy bag present to left.  Lymphatics: Non tender without lymphadenopathy.  Musculoskeletal: Full ROM, 5/5 strength, Normal gait Skin: Warm, dry without rashes, lesions, ecchymosis.  Neuro: Cranial nerves intact. No cerebellar symptoms.  Psych: Awake and oriented X 3, normal affect, Insight and Judgment appropriate.   Izora Ribas, NP 9:04 AM [facility]

## 2018-09-06 ENCOUNTER — Ambulatory Visit: Payer: Self-pay | Admitting: Adult Health

## 2018-09-11 ENCOUNTER — Other Ambulatory Visit: Payer: Self-pay | Admitting: Internal Medicine

## 2018-09-11 DIAGNOSIS — E349 Endocrine disorder, unspecified: Secondary | ICD-10-CM

## 2018-09-16 NOTE — Progress Notes (Signed)
FOLLOW UP  Assessment and Plan:   Hypertension Previously well controlled; he has stopped taking medication - encouraged to restart - discussed goal of 130/80 Monitor blood pressure at home; patient to call if consistently greater than 130/80 Continue DASH diet.   Reminder to go to the ER if any CP, SOB, nausea, dizziness, severe HA, changes vision/speech, left arm numbness and tingling and jaw pain. Keep close follow up in March.   Cholesterol Currently near goal; continue rosuvastatin 20 mg daily  Continue low cholesterol diet and exercise.  Check lipid panel.   Other abnormal glucose Last several A1Cs at goal Continue diet and exercise.  Perform daily foot/skin check, notify office of any concerning changes.  Defer A1C; check CMP/GFR  Obesity with co morbidities Long discussion about weight loss, diet, and exercise Recommended diet heavy in fruits and veggies and low in animal meats, cheeses, and dairy products, appropriate calorie intake Discussed ideal weight for height, end goal of 170lb, initial goal of 210lb set He will work on cutting portions, sweets, splurge meals Will follow up in 3 months  Vitamin D Def At goal at last visit; continue supplementation to maintain goal of 70-100 Defer Vit D level  Hypogonadism - continue replacement therapy, check testosterone levels as needed.   Colostomy [facility]) Doing well without complications.   Continue diet and meds as discussed. Further disposition pending results of labs. Discussed med's effects and SE's.   Over 30 minutes of exam, counseling, chart review, and critical decision making was performed.   Future Appointments  Date Time Provider [place]  11/29/2018  9:30 AM [name], MD [facility] None  06/23/2019  3:00 PM [name], MD [facility] None    ----------------------------------------------------------------------------------------------------------------------  HPI 58 y.o. male   presents for 3 month follow up on hypertension, cholesterol, prediabetes, hypogonadism on testosterone supplementations, obesity and vitamin D deficiency.  He has hx of rectal cancer with resection and colostomy in 2009 - he is doing well since.   BMI is Body mass index is 33.85 kg/m., he has been working on diet and exercise, admits he gained a bit over the holidays. He is working on cutting on portions and sweets. Walks daily and is pushing vegetables, cutting down on red meats and dairy.  Wt Readings from Last 3 Encounters:  09/17/18 222 lb 9.6 oz (101 kg)  05/18/18 220 lb 6.4 oz (100 kg)  02/18/18 219 lb 3.2 oz (99.4 kg)   He has been checking his BP at home, stopped taking medication, today their BP is BP: 126/82  He does workout - he walks 30 mins daily.  He denies chest pain, shortness of breath, dizziness.   He is on cholesterol medication - rosuvastatin 20 mg daily - and denies myalgias. His LDL cholesterol is at goal. The cholesterol last visit was:   Lab Results  Component Value Date   CHOL 147 05/18/2018   HDL 43 05/18/2018   LDLCALC 72 05/18/2018   TRIG 220 (H) 05/18/2018   CHOLHDL 3.4 05/18/2018    He has been working on diet and exercise for glucose management, and denies increased appetite, nausea, paresthesia of the feet, polydipsia, polyuria, visual disturbances and vomiting. Last A1C in the office was:  Lab Results  Component Value Date   HGBA1C 5.6 05/18/2018   Patient is on Vitamin D supplement and at goal at the last check:    Lab Results  Component Value Date   VD25OH 81 05/18/2018     He has  a history of testosterone deficiency and is on testosterone replacement. He states that the testosterone helps with his energy, libido, muscle mass. Lab Results  Component Value Date   TESTOSTERONE 161 (L) 05/18/2018   Patient has dx of gout but not on allopurinol in last few years and does not report a recent flare, he has worked on lifestyle modification (avoiding  processed meats and limiting alcohol).  Lab Results  Component Value Date   LABURIC 7.9 05/18/2018    Current Medications:  Current Outpatient Medications on File Prior to Visit  Medication Sig  . aspirin 81 MG tablet Take 81 mg by mouth daily.  . Cholecalciferol (VITAMIN D-3) 1000 UNITS CAPS Take 8,000 Units by mouth daily.  . IRON PO Take 500 mg by mouth every other day.  . LOTEMAX 0.5 % GEL   . rosuvastatin (CRESTOR) 40 MG tablet Take 1/2 to 1 tablet daily or as directed for Cholesterol  . sildenafil (VIAGRA) 100 MG tablet Take 1/2 to 1 tablet daily as needed for XXXX  . Testosterone 20.25 MG/ACT (1.62%) GEL APPLY 2 PUMPS DAILY AS DIRECTED  . vitamin B-12 (CYANOCOBALAMIN) 500 MCG tablet Take 500 mcg by mouth daily.   No current facility-administered medications on file prior to visit.      Allergies:  Allergies  Allergen Reactions  . Oxycodone Nausea And Vomiting     Medical History:  Past Medical History:  Diagnosis Date  . Arthritis   . Hyperlipidemia   . Hypertension   . Hypogonadism male   . Pre-diabetes   . 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  HENT: Positive for tinnitus (Ongoing chronic, bilateral, improved with hearing aids). Negative for hearing loss.   Eyes: Negative for blurred vision and double vision.  Respiratory: Negative for cough, shortness of breath and wheezing.   Cardiovascular: Negative for chest pain, palpitations, orthopnea, claudication and leg swelling.  Gastrointestinal: Negative for abdominal pain, blood in stool, constipation, diarrhea, heartburn, melena, nausea and vomiting.  Genitourinary: Negative.   Musculoskeletal: Negative for joint pain and myalgias.  Skin: Negative for rash.  Neurological: Negative for dizziness, tingling, sensory  change, weakness and headaches.  Endo/Heme/Allergies: Negative for polydipsia.  Psychiatric/Behavioral: Negative.   All other systems reviewed and are negative.   Physical Exam: BP 126/82   Pulse 82   Temp 98.1 F (36.7 C)   Ht 5\' 8"  (1.727 m)   Wt 222 lb 9.6 oz (101 kg)   SpO2 97%   BMI 33.85 kg/m  Wt Readings from Last 3 Encounters:  09/17/18 222 lb 9.6 oz (101 kg)  05/18/18 220 lb 6.4 oz (100 kg)  02/18/18 219 lb 3.2 oz (99.4 kg)   General Appearance: Well nourished, in no apparent distress. Eyes: PERRLA, EOMs, conjunctiva no swelling or erythema Sinuses: No Frontal/maxillary tenderness ENT/Mouth: Ext aud canals clear, TMs without erythema, bulging. No erythema, swelling, or exudate on post pharynx.  Tonsils not swollen or erythematous. With bilateral hearing aids.  Neck: Supple, thyroid normal.  Respiratory: Respiratory effort normal, BS equal bilaterally without rales, rhonchi, wheezing or stridor.  Cardio: RRR with no MRGs. Brisk peripheral pulses without edema.  Abdomen: Soft, + BS.  Non tender, no guarding, rebound, hernias, masses. Colostomy bag present to left without ulcers, skin breakdown, purulent discharge/mucus.  Lymphatics: Non tender without lymphadenopathy.  Musculoskeletal:  Full ROM, 5/5 strength, Normal gait Skin: Warm, dry without rashes, lesions, ecchymosis.  Neuro: Cranial nerves intact. No cerebellar symptoms.  Psych: Awake and oriented X 3, normal affect, Insight and Judgment appropriate.   Izora Ribas, NP 9:11 AM [facility]

## 2018-09-17 ENCOUNTER — Encounter: Payer: Self-pay | Admitting: Adult Health

## 2018-09-17 ENCOUNTER — Ambulatory Visit (INDEPENDENT_AMBULATORY_CARE_PROVIDER_SITE_OTHER): Payer: 59 | Admitting: Adult Health

## 2018-09-17 VITALS — BP 126/82 | HR 82 | Temp 98.1°F | Ht 68.0 in | Wt 222.6 lb

## 2018-09-17 DIAGNOSIS — E559 Vitamin D deficiency, unspecified: Secondary | ICD-10-CM

## 2018-09-17 DIAGNOSIS — E349 Endocrine disorder, unspecified: Secondary | ICD-10-CM | POA: Diagnosis not present

## 2018-09-17 DIAGNOSIS — E785 Hyperlipidemia, unspecified: Secondary | ICD-10-CM | POA: Diagnosis not present

## 2018-09-17 DIAGNOSIS — E669 Obesity, unspecified: Secondary | ICD-10-CM

## 2018-09-17 DIAGNOSIS — R7309 Other abnormal glucose: Secondary | ICD-10-CM

## 2018-09-17 DIAGNOSIS — I10 Essential (primary) hypertension: Secondary | ICD-10-CM | POA: Diagnosis not present

## 2018-09-17 DIAGNOSIS — Z933 Colostomy status: Secondary | ICD-10-CM

## 2018-09-17 DIAGNOSIS — Z79899 Other long term (current) drug therapy: Secondary | ICD-10-CM

## 2018-09-17 LAB — LIPID PANEL
Cholesterol: 142 mg/dL (ref <200)
HDL: 47 mg/dL (ref >40)
LDL Cholesterol (Calc): 79 mg/dL (calc)
NON-HDL CHOLESTEROL (CALC): 95 mg/dL (ref <130)
TRIGLYCERIDES: 79 mg/dL (ref <150)
Total CHOL/HDL Ratio: 3 (calc) (ref <5.0)

## 2018-09-17 LAB — COMPLETE METABOLIC PANEL WITH GFR
AG RATIO: 2 (calc) (ref 1.0–2.5)
ALBUMIN MSPROF: 4.3 g/dL (ref 3.6–5.1)
ALKALINE PHOSPHATASE (APISO): 41 U/L (ref 40–115)
ALT: 21 U/L (ref 9–46)
AST: 14 U/L (ref 10–35)
BILIRUBIN TOTAL: 0.8 mg/dL (ref 0.2–1.2)
BUN: 15 mg/dL (ref 7–25)
CO2: 27 mmol/L (ref 20–32)
Calcium: 9.6 mg/dL (ref 8.6–10.3)
Chloride: 104 mmol/L (ref 98–110)
Creat: 0.98 mg/dL (ref 0.70–1.33)
GFR, Est African American: 97 mL/min/{1.73_m2} (ref >=60)
GFR, Est Non African American: 84 mL/min/{1.73_m2} (ref >=60)
GLOBULIN: 2.2 g/dL (ref 1.9–3.7)
Glucose, Bld: 94 mg/dL (ref 65–99)
Potassium: 4.5 mmol/L (ref 3.5–5.3)
SODIUM: 138 mmol/L (ref 135–146)
Total Protein: 6.5 g/dL (ref 6.1–8.1)

## 2018-09-17 LAB — CBC WITH DIFFERENTIAL/PLATELET
Absolute Monocytes: 333 cells/uL (ref 200–950)
BASOS ABS: 20 {cells}/uL (ref 0–200)
Basophils Relative: 0.4 %
EOS ABS: 78 {cells}/uL (ref 15–500)
Eosinophils Relative: 1.6 %
HEMATOCRIT: 44.9 % (ref 38.5–50.0)
HEMOGLOBIN: 15.2 g/dL (ref 13.2–17.1)
LYMPHS ABS: 1313 {cells}/uL (ref 850–3900)
MCH: 27.8 pg (ref 27.0–33.0)
MCHC: 33.9 g/dL (ref 32.0–36.0)
MCV: 82.1 fL (ref 80.0–100.0)
MONOS PCT: 6.8 %
MPV: 11.7 fL (ref 7.5–12.5)
NEUTROS ABS: 3156 {cells}/uL (ref 1500–7800)
NEUTROS PCT: 64.4 %
Platelets: 183 10*3/uL (ref 140–400)
RBC: 5.47 10*6/uL (ref 4.20–5.80)
RDW: 13.5 % (ref 11.0–15.0)
Total Lymphocyte: 26.8 %
WBC: 4.9 10*3/uL (ref 3.8–10.8)

## 2018-09-17 LAB — TSH: TSH: 1.38 mIU/L (ref 0.40–4.50)

## 2018-09-17 NOTE — Patient Instructions (Signed)
Goals    . Weight (lb) < 210 lb (95.3 kg) (pt-stated)     End goal 170lb Cut portions, sweets Increase fruits/vegetables Limit splurge meals - I.e. burgers Meal plan for lunch and take with       Know what a healthy weight is for you (roughly BMI <25) and aim to maintain this  Aim for 7+ servings of fruits and vegetables daily  65-80+ fluid ounces of water or unsweet tea for healthy kidneys  Limit to max 1 drink of alcohol per day; avoid smoking/tobacco  Limit animal fats in diet for cholesterol and heart health - choose grass fed whenever available  Avoid highly processed foods, and foods high in saturated/trans fats  Aim for low stress - take time to unwind and care for your mental health  Aim for 150 min of moderate intensity exercise weekly for heart health, and weights twice weekly for bone health  Aim for 7-9 hours of sleep daily       When it comes to diets, agreement about the perfect plan isn't easy to find, even among the experts. Experts at the [employer] developed an idea known as the Healthy Eating Plate. Just imagine a plate divided into logical, healthy portions.  The emphasis is on diet quality:  Load up on vegetables and fruits - one-half of your plate: Aim for color and variety, and remember that potatoes don't count.  Go for whole grains - one-quarter of your plate: Whole wheat, barley, wheat berries, quinoa, oats, brown rice, and foods made with them. If you want pasta, go with whole wheat pasta.  Protein power - one-quarter of your plate: Fish, chicken, beans, and nuts are all healthy, versatile protein sources. Limit red meat.  The diet, however, does go beyond the plate, offering a few other suggestions.  Use healthy plant oils, such as olive, canola, soy, corn, sunflower and peanut. Check the labels, and avoid partially hydrogenated oil, which have unhealthy trans fats.  If you're thirsty, drink water. Coffee and tea are  good in moderation, but skip sugary drinks and limit milk and dairy products to one or two daily servings.  The type of carbohydrate in the diet is more important than the amount. Some sources of carbohydrates, such as vegetables, fruits, whole grains, and beans-are healthier than others.  Finally, stay active.

## 2018-11-11 ENCOUNTER — Other Ambulatory Visit: Payer: Self-pay | Admitting: Internal Medicine

## 2018-11-11 DIAGNOSIS — E349 Endocrine disorder, unspecified: Secondary | ICD-10-CM

## 2018-11-29 ENCOUNTER — Ambulatory Visit: Payer: Self-pay | Admitting: Internal Medicine

## 2018-12-21 ENCOUNTER — Encounter: Payer: Self-pay | Admitting: Internal Medicine

## 2018-12-21 NOTE — Progress Notes (Signed)
History of Present Illness:      This very nice 58 y.o. MWM presents for 6 month follow up with HTN, HLD, Pre-Diabetes and Vitamin D Deficiency.       Patient has hx/o villous adenoma (2006) & is s/p AP ressection /Colostomy for a rectal Ca in 2009.       Patient is treated for HTN (2012) & BP has been controlled at home. Today's BP is near goal 136/88 with slightly elevated diasBP. Patient has had no complaints of any cardiac type chest pain, palpitations, dyspnea / orthopnea / PND, dizziness, claudication, or dependent edema.      Hyperlipidemia is controlled with diet & rosuvastatin. Patient denies myalgias or other med SE's. Last Lipids were at goal: Lab Results  Component Value Date   CHOL 142 09/17/2018   HDL 47 09/17/2018   LDLCALC 79 09/17/2018   TRIG 79 09/17/2018   CHOLHDL 3.0 09/17/2018        Also, the patient has Morbid Obesity (BMI 33+) and history of PreDiabetes (A1c 5.7% / 2012)  and has had no symptoms of reactive hypoglycemia, diabetic polys, paresthesias or visual blurring.  Last A1c was Normal & at goal: Lab Results  Component Value Date   HGBA1C 5.6 05/18/2018      Further, the patient also has history of Vitamin D Deficiency ("14" / 2009) and supplements vitamin D without any suspected side-effects. Last vitamin D was at goal: Lab Results  Component Value Date   VD25OH 81 05/18/2018   Current Outpatient Medications on File Prior to Visit  Medication Sig  . aspirin 81 MG tablet Take 81 mg by mouth daily.  . Cholecalciferol (VITAMIN D-3) 1000 UNITS CAPS Take 8,000 Units by mouth daily.  . IRON PO Take 500 mg by mouth every other day.  . LOTEMAX 0.5 % GEL   . rosuvastatin (CRESTOR) 40 MG tablet Take 1/2 to 1 tablet daily or as directed for Cholesterol  . sildenafil (VIAGRA) 100 MG tablet Take 1/2 to 1 tablet daily as needed for XXXX  . Testosterone 20.25 MG/ACT (1.62%) GEL Apply 2 pumps Daily  . vitamin B-12 (CYANOCOBALAMIN) 500 MCG tablet Take 500 mcg by  mouth daily.  Marland Kitchen zinc gluconate 50 MG tablet Take 50 mg by mouth daily.   No current facility-administered medications on file prior to visit.    Allergies  Allergen Reactions  . Oxycodone Nausea And Vomiting   PMHx:   Past Medical History:  Diagnosis Date  . Arthritis   . Hyperlipidemia   . Hypertension   . Hypogonadism male   . Pre-diabetes   . 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 PPD Test 09/09/2013, 09/11/2014, 09/20/2015, 04/16/2017, 05/18/2018  . Tdap 02/17/2008   Past Surgical History:  Procedure Laterality Date  . COLON SURGERY N/A 2009   AP Rectal Resection  . colonoscopsy  09/2011  . HERNIA REPAIR    . RECONSTRUCT / STABILIZE DISTAL ULNA    . WISDOM TOOTH EXTRACTION     FHx:    Reviewed / unchanged  SHx:    Reviewed / unchanged   Systems Review:  Constitutional: Denies fever, chills, wt changes, headaches, insomnia, fatigue, night sweats, change in appetite. Eyes: Denies redness, blurred vision, diplopia, discharge, itchy, watery eyes.  ENT: Denies discharge, congestion, post nasal drip, epistaxis, sore throat, earache, hearing loss, dental pain, tinnitus, vertigo, sinus pain, snoring.  CV: Denies chest pain, palpitations, irregular heartbeat,  syncope, dyspnea, diaphoresis, orthopnea, PND, claudication or edema. Respiratory: denies cough, dyspnea, DOE, pleurisy, hoarseness, laryngitis, wheezing.  Gastrointestinal: Denies dysphagia, odynophagia, heartburn, reflux, water brash, abdominal pain or cramps, nausea, vomiting, bloating, diarrhea, constipation, hematemesis, melena, hematochezia  or hemorrhoids. Genitourinary: Denies dysuria, frequency, urgency, nocturia, hesitancy, discharge, hematuria or flank pain. Musculoskeletal: Denies arthralgias, myalgias, stiffness, jt. swelling, pain, limping or strain/sprain.  Skin: Denies pruritus, rash, hives, warts, acne, eczema or change in skin  lesion(s). Neuro: No weakness, tremor, incoordination, spasms, paresthesia or pain. Psychiatric: Denies confusion, memory loss or sensory loss. Endo: Denies change in weight, skin or hair change.  Heme/Lymph: No excessive bleeding, bruising or enlarged lymph nodes.  Physical Exam  BP 136/88   Pulse 76   Temp 97.6 F (36.4 C)   Resp 16   Ht 5\' 8"  (1.727 m)   Wt 215 lb (97.5 kg)   BMI 32.69 kg/m   Appears  Over nourished, well groomed  and in no distress.  Eyes: PERRLA, EOMs, conjunctiva no swelling or erythema. Sinuses: No frontal/maxillary tenderness ENT/Mouth: EAC's clear, TM's nl w/o erythema, bulging. Nares clear w/o erythema, swelling, exudates. Oropharynx clear without erythema or exudates. Oral hygiene is good. Tongue normal, non obstructing. Hearing intact.  Neck: Supple. Thyroid not palpable. Car 2+/2+ without bruits, nodes or JVD. Chest: Respirations nl with BS clear & equal w/o rales, rhonchi, wheezing or stridor.  Cor: Heart sounds normal w/ regular rate and rhythm without sig. murmurs, gallops, clicks or rubs. Peripheral pulses normal and equal  without edema.  Abdomen: Soft & bowel sounds normal. Non-tender w/o guarding, rebound, hernias, masses or organomegaly.  Lymphatics: Unremarkable.  Musculoskeletal: Full ROM all peripheral extremities, joint stability, 5/5 strength and normal gait.  Skin: Warm, dry without exposed rashes, lesions or ecchymosis apparent.  Neuro: Cranial nerves intact, reflexes equal bilaterally. Sensory-motor testing grossly intact. Tendon reflexes grossly intact.  Pysch: Alert & oriented x 3.  Insight and judgement nl & appropriate. No ideations.  Assessment and Plan:  1. Essential hypertension  - Continue medication, monitor blood pressure at home.  - Continue DASH diet.  Reminder to go to the ER if any CP,  SOB, nausea, dizziness, severe HA, changes vision/speech.  - CBC with Differential/Platelet - COMPLETE METABOLIC PANEL WITH GFR -  Magnesium - TSH  2. Hyperlipidemia, mixed  - Continue diet/meds, exercise,& lifestyle modifications.  - Continue monitor periodic cholesterol/liver & renal functions   - Lipid panel - TSH  3. Abnormal glucose  - Continue diet, exercise  - Lifestyle modifications.  - Monitor appropriate labs.  - Hemoglobin A1c - Insulin, random  4. Vitamin D deficiency  - Continue supplementation.  - VITAMIN D 25 Hydroxyl  5. Idiopathic gout  - Uric acid  6. Medication management  - CBC with Differential/Platelet - COMPLETE METABOLIC PANEL WITH GFR - Magnesium - Lipid panel - TSH - Hemoglobin A1c - Insulin, random - VITAMIN D 25 Hydroxyl - Uric acid       Discussed  regular exercise, BP monitoring, weight control to achieve/maintain BMI less than 25 and discussed med and SE's. Recommended labs to assess and monitor clinical status with further disposition pending results of labs. I discussed the assessment and treatment plan with the patient. The patient was provided an opportunity to ask questions and all were answered. The patient agreed with the plan and demonstrated an understanding of the instructions. Over 30 minutes of exam, counseling, chart review was performed.      Kirtland Bouchard, MD

## 2018-12-21 NOTE — Patient Instructions (Signed)

## 2018-12-22 ENCOUNTER — Ambulatory Visit: Payer: 59 | Admitting: Internal Medicine

## 2018-12-22 ENCOUNTER — Other Ambulatory Visit: Payer: Self-pay

## 2018-12-22 VITALS — BP 136/88 | HR 76 | Temp 97.6°F | Resp 16 | Ht 68.0 in | Wt 215.0 lb

## 2018-12-22 DIAGNOSIS — E559 Vitamin D deficiency, unspecified: Secondary | ICD-10-CM | POA: Diagnosis not present

## 2018-12-22 DIAGNOSIS — I10 Essential (primary) hypertension: Secondary | ICD-10-CM | POA: Diagnosis not present

## 2018-12-22 DIAGNOSIS — Z79899 Other long term (current) drug therapy: Secondary | ICD-10-CM

## 2018-12-22 DIAGNOSIS — M1000 Idiopathic gout, unspecified site: Secondary | ICD-10-CM

## 2018-12-22 DIAGNOSIS — R7309 Other abnormal glucose: Secondary | ICD-10-CM

## 2018-12-22 DIAGNOSIS — E782 Mixed hyperlipidemia: Secondary | ICD-10-CM | POA: Diagnosis not present

## 2018-12-23 LAB — LIPID PANEL
Cholesterol: 147 mg/dL (ref <200)
HDL: 46 mg/dL (ref >=40)
LDL Cholesterol (Calc): 80 mg/dL (calc)
Non-HDL Cholesterol (Calc): 101 mg/dL (calc) (ref <130)
Total CHOL/HDL Ratio: 3.2 (calc) (ref <5.0)
Triglycerides: 115 mg/dL (ref <150)

## 2018-12-23 LAB — COMPLETE METABOLIC PANEL WITH GFR
AG Ratio: 1.8 (calc) (ref 1.0–2.5)
ALT: 21 U/L (ref 9–46)
AST: 14 U/L (ref 10–35)
Albumin: 4.4 g/dL (ref 3.6–5.1)
Alkaline phosphatase (APISO): 49 U/L (ref 35–144)
BUN: 17 mg/dL (ref 7–25)
CO2: 28 mmol/L (ref 20–32)
Calcium: 10 mg/dL (ref 8.6–10.3)
Chloride: 104 mmol/L (ref 98–110)
Creat: 1 mg/dL (ref 0.70–1.33)
GFR, Est African American: 95 mL/min/{1.73_m2} (ref >=60)
GFR, Est Non African American: 82 mL/min/{1.73_m2} (ref >=60)
Globulin: 2.4 g/dL (calc) (ref 1.9–3.7)
Glucose, Bld: 86 mg/dL (ref 65–99)
Potassium: 4.5 mmol/L (ref 3.5–5.3)
Sodium: 140 mmol/L (ref 135–146)
Total Bilirubin: 0.7 mg/dL (ref 0.2–1.2)
Total Protein: 6.8 g/dL (ref 6.1–8.1)

## 2018-12-23 LAB — CBC WITH DIFFERENTIAL/PLATELET
Absolute Monocytes: 439 cells/uL (ref 200–950)
Basophils Absolute: 29 cells/uL (ref 0–200)
Basophils Relative: 0.5 %
Eosinophils Absolute: 80 cells/uL (ref 15–500)
Eosinophils Relative: 1.4 %
HCT: 45 % (ref 38.5–50.0)
Hemoglobin: 15.4 g/dL (ref 13.2–17.1)
Lymphs Abs: 1818 cells/uL (ref 850–3900)
MCH: 28.4 pg (ref 27.0–33.0)
MCHC: 34.2 g/dL (ref 32.0–36.0)
MCV: 82.9 fL (ref 80.0–100.0)
MPV: 11.6 fL (ref 7.5–12.5)
Monocytes Relative: 7.7 %
Neutro Abs: 3335 cells/uL (ref 1500–7800)
Neutrophils Relative %: 58.5 %
Platelets: 192 10*3/uL (ref 140–400)
RBC: 5.43 10*6/uL (ref 4.20–5.80)
RDW: 13.6 % (ref 11.0–15.0)
Total Lymphocyte: 31.9 %
WBC: 5.7 10*3/uL (ref 3.8–10.8)

## 2018-12-23 LAB — HEMOGLOBIN A1C
Hgb A1c MFr Bld: 5.6 % of total Hgb (ref <5.7)
Mean Plasma Glucose: 114 (calc)
eAG (mmol/L): 6.3 (calc)

## 2018-12-23 LAB — MAGNESIUM: Magnesium: 2.2 mg/dL (ref 1.5–2.5)

## 2018-12-23 LAB — INSULIN, RANDOM: Insulin: 4.2 u[IU]/mL

## 2018-12-23 LAB — URIC ACID: Uric Acid, Serum: 7 mg/dL (ref 4.0–8.0)

## 2018-12-23 LAB — VITAMIN D 25 HYDROXY (VIT D DEFICIENCY, FRACTURES): Vit D, 25-Hydroxy: 81 ng/mL (ref 30–100)

## 2018-12-23 LAB — TSH: TSH: 1.55 mIU/L (ref 0.40–4.50)

## 2019-03-21 NOTE — Progress Notes (Deleted)
FOLLOW UP  Assessment and Plan:   Hypertension Previously well controlled; he has stopped taking medication - encouraged to restart - discussed goal of 130/80 Monitor blood pressure at home; patient to call if consistently greater than 130/80 Continue DASH diet.   Reminder to go to the ER if any CP, SOB, nausea, dizziness, severe HA, changes vision/speech, left arm numbness and tingling and jaw pain. Keep close follow up in March.   Cholesterol Currently near goal; continue rosuvastatin 20 mg daily  Continue low cholesterol diet and exercise.  Check lipid panel.   Other abnormal glucose Last several A1Cs at goal Continue diet and exercise.  Perform daily foot/skin check, notify office of any concerning changes.  Defer A1C; check CMP/GFR  Obesity with co morbidities Long discussion about weight loss, diet, and exercise Recommended diet heavy in fruits and veggies and low in animal meats, cheeses, and dairy products, appropriate calorie intake Discussed ideal weight for height, end goal of 170lb, initial goal of 210lb set He will work on cutting portions, sweets, splurge meals Will follow up in 3 months  Vitamin D Def At goal at last visit; continue supplementation to maintain goal of 70-100 Defer Vit D level  Hypogonadism - continue replacement therapy, check testosterone levels as needed.   Colostomy [facility]) Doing well without complications.   Continue diet and meds as discussed. Further disposition pending results of labs. Discussed med's effects and SE's.   Over 30 minutes of exam, counseling, chart review, and critical decision making was performed.   Future Appointments  Date Time Provider [place]  03/23/2019  9:30 AM Vicie Mutters, PA-C [facility] None  06/23/2019  3:00 PM [name], MD [facility] None    ----------------------------------------------------------------------------------------------------------------------  HPI 58 y.o. male   presents for 3 month follow up on hypertension, cholesterol, prediabetes, hypogonadism on testosterone supplementations, obesity and vitamin D deficiency.  He has hx of rectal cancer with resection and colostomy in 2009 - he is doing well since.   BMI is There is no height or weight on file to calculate BMI., he has been working on diet and exercise, admits he gained a bit over the holidays. He is working on cutting on portions and sweets. Walks daily and is pushing vegetables, cutting down on red meats and dairy.  Wt Readings from Last 3 Encounters:  12/22/18 215 lb (97.5 kg)  09/17/18 222 lb 9.6 oz (101 kg)  05/18/18 220 lb 6.4 oz (100 kg)   He has been checking his BP at home, stopped taking medication, today their BP is    He does workout - he walks 30 mins daily.  He denies chest pain, shortness of breath, dizziness.   He is on cholesterol medication - rosuvastatin 20 mg daily - and denies myalgias. His LDL cholesterol is at goal. The cholesterol last visit was:   Lab Results  Component Value Date   CHOL 147 12/22/2018   HDL 46 12/22/2018   LDLCALC 80 12/22/2018   TRIG 115 12/22/2018   CHOLHDL 3.2 12/22/2018    He has been working on diet and exercise for glucose management, and denies increased appetite, nausea, paresthesia of the feet, polydipsia, polyuria, visual disturbances and vomiting. Last A1C in the office was:  Lab Results  Component Value Date   HGBA1C 5.6 12/22/2018   Patient is on Vitamin D supplement and at goal at the last check:    Lab Results  Component Value Date   VD25OH 81 12/22/2018  He has a history of testosterone deficiency and is on testosterone replacement. He states that the testosterone helps with his energy, libido, muscle mass. Lab Results  Component Value Date   TESTOSTERONE 161 (L) 05/18/2018   Patient has dx of gout but not on allopurinol in last few years and does not report a recent flare, he has worked on lifestyle modification (avoiding  processed meats and limiting alcohol).  Lab Results  Component Value Date   LABURIC 7.0 12/22/2018    Current Medications:  Current Outpatient Medications on File Prior to Visit  Medication Sig  . aspirin 81 MG tablet Take 81 mg by mouth daily.  . Cholecalciferol (VITAMIN D-3) 1000 UNITS CAPS Take 8,000 Units by mouth daily.  . IRON PO Take 500 mg by mouth every other day.  . LOTEMAX 0.5 % GEL   . rosuvastatin (CRESTOR) 40 MG tablet Take 1/2 to 1 tablet daily or as directed for Cholesterol  . sildenafil (VIAGRA) 100 MG tablet Take 1/2 to 1 tablet daily as needed for XXXX  . Testosterone 20.25 MG/ACT (1.62%) GEL Apply 2 pumps Daily  . vitamin B-12 (CYANOCOBALAMIN) 500 MCG tablet Take 500 mcg by mouth daily.  Marland Kitchen zinc gluconate 50 MG tablet Take 50 mg by mouth daily.   No current facility-administered medications on file prior to visit.      Allergies:  Allergies  Allergen Reactions  . Oxycodone Nausea And Vomiting     Medical History:  Past Medical History:  Diagnosis Date  . Arthritis   . Hyperlipidemia   . Hypertension   . Hypogonadism male   . Pre-diabetes   . 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  HENT: Positive for tinnitus (Ongoing chronic, bilateral, improved with hearing aids). Negative for hearing loss.   Eyes: Negative for blurred vision and double vision.  Respiratory: Negative for cough, shortness of breath and wheezing.   Cardiovascular: Negative for chest pain, palpitations, orthopnea, claudication and leg swelling.  Gastrointestinal: Negative for abdominal pain, blood in stool, constipation, diarrhea, heartburn, melena, nausea and vomiting.  Genitourinary: Negative.   Musculoskeletal: Negative for joint pain and myalgias.  Skin: Negative for rash.  Neurological:  Negative for dizziness, tingling, sensory change, weakness and headaches.  Endo/Heme/Allergies: Negative for polydipsia.  Psychiatric/Behavioral: Negative.   All other systems reviewed and are negative.   Physical Exam: There were no vitals taken for this visit. Wt Readings from Last 3 Encounters:  12/22/18 215 lb (97.5 kg)  09/17/18 222 lb 9.6 oz (101 kg)  05/18/18 220 lb 6.4 oz (100 kg)   General Appearance: Well nourished, in no apparent distress. Eyes: PERRLA, EOMs, conjunctiva no swelling or erythema Sinuses: No Frontal/maxillary tenderness ENT/Mouth: Ext aud canals clear, TMs without erythema, bulging. No erythema, swelling, or exudate on post pharynx.  Tonsils not swollen or erythematous. With bilateral hearing aids.  Neck: Supple, thyroid normal.  Respiratory: Respiratory effort normal, BS equal bilaterally without rales, rhonchi, wheezing or stridor.  Cardio: RRR with no MRGs. Brisk peripheral pulses without edema.  Abdomen: Soft, + BS.  Non tender, no guarding, rebound, hernias, masses. Colostomy bag present to left without ulcers, skin breakdown, purulent discharge/mucus.  Lymphatics: Non tender without lymphadenopathy.  Musculoskeletal: Full ROM, 5/5 strength, Normal gait Skin: Warm, dry without rashes, lesions, ecchymosis.  Neuro: Cranial nerves intact. No cerebellar  symptoms.  Psych: Awake and oriented X 3, normal affect, Insight and Judgment appropriate.   Vicie Mutters, PA-C 8:31 AM [facility]

## 2019-03-23 ENCOUNTER — Ambulatory Visit: Payer: 59 | Admitting: Physician Assistant

## 2019-03-28 NOTE — Progress Notes (Deleted)
FOLLOW UP  Assessment and Plan:   Hypertension Previously well controlled; he has stopped taking medication - encouraged to restart - discussed goal of 130/80 Monitor blood pressure at home; patient to call if consistently greater than 130/80 Continue DASH diet.   Reminder to go to the ER if any CP, SOB, nausea, dizziness, severe HA, changes vision/speech, left arm numbness and tingling and jaw pain. Keep close follow up in March.   Cholesterol Currently near goal; continue rosuvastatin 20 mg daily  Continue low cholesterol diet and exercise.  Check lipid panel.   Other abnormal glucose Last several A1Cs at goal Continue diet and exercise.  Perform daily foot/skin check, notify office of any concerning changes.  Defer A1C; check CMP/GFR  Obesity with co morbidities Long discussion about weight loss, diet, and exercise Recommended diet heavy in fruits and veggies and low in animal meats, cheeses, and dairy products, appropriate calorie intake Discussed ideal weight for height, end goal of 170lb, initial goal of 210lb set He will work on cutting portions, sweets, splurge meals Will follow up in 3 months  Vitamin D Def At goal at last visit; continue supplementation to maintain goal of 70-100 Defer Vit D level  Hypogonadism - continue replacement therapy, check testosterone levels as needed.   Colostomy [facility]) Doing well without complications.   Continue diet and meds as discussed. Further disposition pending results of labs. Discussed med's effects and SE's.   Over 30 minutes of exam, counseling, chart review, and critical decision making was performed.   Future Appointments  Date Time Provider [place]  03/29/2019  9:00 AM Vicie Mutters, PA-C [facility] None  06/23/2019  3:00 PM [name], MD [facility] None    ----------------------------------------------------------------------------------------------------------------------  HPI 58 y.o. male   presents for 3 month follow up on hypertension, cholesterol, prediabetes, hypogonadism on testosterone supplementations, obesity and vitamin D deficiency.  He has hx of rectal cancer with resection and colostomy in 2009 - he is doing well since.   BMI is There is no height or weight on file to calculate BMI., he has been working on diet and exercise, admits he gained a bit over the holidays. He is working on cutting on portions and sweets. Walks daily and is pushing vegetables, cutting down on red meats and dairy.  Wt Readings from Last 3 Encounters:  12/22/18 215 lb (97.5 kg)  09/17/18 222 lb 9.6 oz (101 kg)  05/18/18 220 lb 6.4 oz (100 kg)   He has been checking his BP at home, stopped taking medication, today their BP is    He does workout - he walks 30 mins daily.  He denies chest pain, shortness of breath, dizziness.   He is on cholesterol medication - rosuvastatin 20 mg daily - and denies myalgias. His LDL cholesterol is at goal. The cholesterol last visit was:   Lab Results  Component Value Date   CHOL 147 12/22/2018   HDL 46 12/22/2018   LDLCALC 80 12/22/2018   TRIG 115 12/22/2018   CHOLHDL 3.2 12/22/2018    He has been working on diet and exercise for glucose management, and denies increased appetite, nausea, paresthesia of the feet, polydipsia, polyuria, visual disturbances and vomiting. Last A1C in the office was:  Lab Results  Component Value Date   HGBA1C 5.6 12/22/2018   Patient is on Vitamin D supplement and at goal at the last check:    Lab Results  Component Value Date   VD25OH 81 12/22/2018  He has a history of testosterone deficiency and is on testosterone replacement. He states that the testosterone helps with his energy, libido, muscle mass. Lab Results  Component Value Date   TESTOSTERONE 161 (L) 05/18/2018   Patient has dx of gout but not on allopurinol in last few years and does not report a recent flare, he has worked on lifestyle modification (avoiding  processed meats and limiting alcohol).  Lab Results  Component Value Date   LABURIC 7.0 12/22/2018    Current Medications:  Current Outpatient Medications on File Prior to Visit  Medication Sig  . aspirin 81 MG tablet Take 81 mg by mouth daily.  . Cholecalciferol (VITAMIN D-3) 1000 UNITS CAPS Take 8,000 Units by mouth daily.  . IRON PO Take 500 mg by mouth every other day.  . LOTEMAX 0.5 % GEL   . rosuvastatin (CRESTOR) 40 MG tablet Take 1/2 to 1 tablet daily or as directed for Cholesterol  . sildenafil (VIAGRA) 100 MG tablet Take 1/2 to 1 tablet daily as needed for XXXX  . Testosterone 20.25 MG/ACT (1.62%) GEL Apply 2 pumps Daily  . vitamin B-12 (CYANOCOBALAMIN) 500 MCG tablet Take 500 mcg by mouth daily.  Marland Kitchen zinc gluconate 50 MG tablet Take 50 mg by mouth daily.   No current facility-administered medications on file prior to visit.      Allergies:  Allergies  Allergen Reactions  . Oxycodone Nausea And Vomiting     Medical History:  Past Medical History:  Diagnosis Date  . Arthritis   . Hyperlipidemia   . Hypertension   . Hypogonadism male   . Pre-diabetes   . 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  HENT: Positive for tinnitus (Ongoing chronic, bilateral, improved with hearing aids). Negative for hearing loss.   Eyes: Negative for blurred vision and double vision.  Respiratory: Negative for cough, shortness of breath and wheezing.   Cardiovascular: Negative for chest pain, palpitations, orthopnea, claudication and leg swelling.  Gastrointestinal: Negative for abdominal pain, blood in stool, constipation, diarrhea, heartburn, melena, nausea and vomiting.  Genitourinary: Negative.   Musculoskeletal: Negative for joint pain and myalgias.  Skin: Negative for rash.  Neurological:  Negative for dizziness, tingling, sensory change, weakness and headaches.  Endo/Heme/Allergies: Negative for polydipsia.  Psychiatric/Behavioral: Negative.   All other systems reviewed and are negative.   Physical Exam: There were no vitals taken for this visit. Wt Readings from Last 3 Encounters:  12/22/18 215 lb (97.5 kg)  09/17/18 222 lb 9.6 oz (101 kg)  05/18/18 220 lb 6.4 oz (100 kg)   General Appearance: Well nourished, in no apparent distress. Eyes: PERRLA, EOMs, conjunctiva no swelling or erythema Sinuses: No Frontal/maxillary tenderness ENT/Mouth: Ext aud canals clear, TMs without erythema, bulging. No erythema, swelling, or exudate on post pharynx.  Tonsils not swollen or erythematous. With bilateral hearing aids.  Neck: Supple, thyroid normal.  Respiratory: Respiratory effort normal, BS equal bilaterally without rales, rhonchi, wheezing or stridor.  Cardio: RRR with no MRGs. Brisk peripheral pulses without edema.  Abdomen: Soft, + BS.  Non tender, no guarding, rebound, hernias, masses. Colostomy bag present to left without ulcers, skin breakdown, purulent discharge/mucus.  Lymphatics: Non tender without lymphadenopathy.  Musculoskeletal: Full ROM, 5/5 strength, Normal gait Skin: Warm, dry without rashes, lesions, ecchymosis.  Neuro: Cranial nerves intact. No cerebellar  symptoms.  Psych: Awake and oriented X 3, normal affect, Insight and Judgment appropriate.   Vicie Mutters, PA-C 8:16 AM [facility]

## 2019-03-29 ENCOUNTER — Ambulatory Visit: Payer: 59 | Admitting: Physician Assistant

## 2019-05-24 ENCOUNTER — Other Ambulatory Visit: Payer: Self-pay | Admitting: Internal Medicine

## 2019-05-24 DIAGNOSIS — N529 Male erectile dysfunction, unspecified: Secondary | ICD-10-CM

## 2019-05-24 DIAGNOSIS — E349 Endocrine disorder, unspecified: Secondary | ICD-10-CM

## 2019-05-24 DIAGNOSIS — E782 Mixed hyperlipidemia: Secondary | ICD-10-CM

## 2019-06-23 ENCOUNTER — Encounter: Payer: Self-pay | Admitting: Internal Medicine

## 2019-08-02 ENCOUNTER — Other Ambulatory Visit: Payer: Self-pay | Admitting: Internal Medicine

## 2019-08-02 DIAGNOSIS — E782 Mixed hyperlipidemia: Secondary | ICD-10-CM

## 2019-08-23 ENCOUNTER — Other Ambulatory Visit: Payer: Self-pay | Admitting: Internal Medicine

## 2019-08-23 DIAGNOSIS — E349 Endocrine disorder, unspecified: Secondary | ICD-10-CM

## 2019-09-13 ENCOUNTER — Other Ambulatory Visit: Payer: Self-pay

## 2019-09-13 ENCOUNTER — Encounter: Payer: Self-pay | Admitting: Internal Medicine

## 2019-09-13 ENCOUNTER — Ambulatory Visit (INDEPENDENT_AMBULATORY_CARE_PROVIDER_SITE_OTHER): Payer: BC Managed Care – PPO | Admitting: Internal Medicine

## 2019-09-13 VITALS — BP 116/70 | HR 72 | Temp 97.1°F | Resp 16 | Ht 68.0 in | Wt 219.2 lb

## 2019-09-13 DIAGNOSIS — E559 Vitamin D deficiency, unspecified: Secondary | ICD-10-CM

## 2019-09-13 DIAGNOSIS — Z1389 Encounter for screening for other disorder: Secondary | ICD-10-CM

## 2019-09-13 DIAGNOSIS — M1000 Idiopathic gout, unspecified site: Secondary | ICD-10-CM

## 2019-09-13 DIAGNOSIS — Z1329 Encounter for screening for other suspected endocrine disorder: Secondary | ICD-10-CM | POA: Diagnosis not present

## 2019-09-13 DIAGNOSIS — K9409 Other complications of colostomy: Secondary | ICD-10-CM

## 2019-09-13 DIAGNOSIS — E349 Endocrine disorder, unspecified: Secondary | ICD-10-CM

## 2019-09-13 DIAGNOSIS — R7309 Other abnormal glucose: Secondary | ICD-10-CM

## 2019-09-13 DIAGNOSIS — Z0000 Encounter for general adult medical examination without abnormal findings: Secondary | ICD-10-CM

## 2019-09-13 DIAGNOSIS — Z79899 Other long term (current) drug therapy: Secondary | ICD-10-CM | POA: Diagnosis not present

## 2019-09-13 DIAGNOSIS — Z1211 Encounter for screening for malignant neoplasm of colon: Secondary | ICD-10-CM

## 2019-09-13 DIAGNOSIS — Z131 Encounter for screening for diabetes mellitus: Secondary | ICD-10-CM

## 2019-09-13 DIAGNOSIS — Z13220 Encounter for screening for lipoid disorders: Secondary | ICD-10-CM | POA: Diagnosis not present

## 2019-09-13 DIAGNOSIS — Z0001 Encounter for general adult medical examination with abnormal findings: Secondary | ICD-10-CM

## 2019-09-13 DIAGNOSIS — E782 Mixed hyperlipidemia: Secondary | ICD-10-CM

## 2019-09-13 DIAGNOSIS — Z130 Encounter for screening for diseases of the blood and blood-forming organs and certain disorders involving the immune mechanism: Secondary | ICD-10-CM | POA: Diagnosis not present

## 2019-09-13 DIAGNOSIS — N138 Other obstructive and reflux uropathy: Secondary | ICD-10-CM

## 2019-09-13 DIAGNOSIS — Z136 Encounter for screening for cardiovascular disorders: Secondary | ICD-10-CM | POA: Diagnosis not present

## 2019-09-13 DIAGNOSIS — M109 Gout, unspecified: Secondary | ICD-10-CM | POA: Diagnosis not present

## 2019-09-13 DIAGNOSIS — Z125 Encounter for screening for malignant neoplasm of prostate: Secondary | ICD-10-CM | POA: Diagnosis not present

## 2019-09-13 DIAGNOSIS — N401 Enlarged prostate with lower urinary tract symptoms: Secondary | ICD-10-CM

## 2019-09-13 DIAGNOSIS — R350 Frequency of micturition: Secondary | ICD-10-CM | POA: Diagnosis not present

## 2019-09-13 DIAGNOSIS — R5383 Other fatigue: Secondary | ICD-10-CM

## 2019-09-13 DIAGNOSIS — I10 Essential (primary) hypertension: Secondary | ICD-10-CM | POA: Diagnosis not present

## 2019-09-13 MED ORDER — TADALAFIL 20 MG PO TABS: ORAL_TABLET | ORAL | 11 refills | Status: DC

## 2019-09-13 NOTE — Patient Instructions (Signed)
- Link to sign up at [employer] site for the Covid-19 vaccine   [email or web address].[email or web address]   Or call [phone]  +++++++++++++++++++++++++++++++++  Vit D  & Vit C 1,000 mg   are recommended to help protect  against the Covid-19 and other Corona viruses.    Also it's recommended  to take  Zinc 50 mg  to help  protect against the Covid-19   and best place to get  is also on [employer].com  and don't pay more than 6-8 cents /pill !  =============================== Coronavirus (COVID-19) Are you at risk?  Are you at risk for the Coronavirus (COVID-19)?  To be considered HIGH RISK for Coronavirus (COVID-19), you have to meet the following criteria:  . Traveled to [place], [place], [place], [place] or [place]; or in the [place] to [place], [place], [place]  . or [place]; and have fever, cough, and shortness of breath within the last 2 weeks of travel OR . Been in close contact with a person diagnosed with COVID-19 within the last 2 weeks and have  . fever, cough,and shortness of breath .  . IF YOU DO NOT MEET THESE CRITERIA, YOU ARE CONSIDERED LOW RISK FOR COVID-19.  What to do if you are HIGH RISK for COVID-19?  Marland Kitchen If you are having a medical emergency, call 911. . Seek medical care right away. Before you go to a doctor's office, urgent care or emergency department, .  call ahead and tell them about your recent travel, contact with someone diagnosed with COVID-19  .  and your symptoms.  . You should receive instructions from your physician's office regarding next steps of care.  . When you arrive at healthcare provider, tell the healthcare staff immediately you have returned from  . visiting [place], [place], [place], [place] or [place]; or traveled in the [place] to [place], [place],  . [place] or [place] in the last two  weeks or you have been in close contact with a person diagnosed with  . COVID-19 in the last 2 weeks.   . Tell the health care staff about your symptoms: fever, cough and shortness of breath. . After you have been seen by a medical provider, you will be either: o Tested for (COVID-19) and discharged home on quarantine except to seek medical care if  o symptoms worsen, and asked to  - Stay home and avoid contact with others until you get your results (4-5 days)  - Avoid travel on public transportation if possible (such as bus, train, or airplane) or o Sent to the Emergency Department by EMS for evaluation, COVID-19 testing  and  o possible admission depending on your condition and test results.  What to do if you are LOW RISK for COVID-19?  Reduce your risk of any infection by using the same precautions used for avoiding the common cold or flu:  Marland Kitchen Wash your hands often with soap and warm water for at least 20 seconds.  If soap and water are not readily available,  . use an alcohol-based hand sanitizer with at least 60% alcohol.  . If coughing or sneezing, cover your mouth and nose by coughing or sneezing into the elbow areas of your shirt or coat, .  into a tissue or into your sleeve (not your hands). . Avoid shaking hands with others and consider head nods or verbal greetings only. . Avoid touching your eyes, nose, or mouth with unwashed hands.  Marland Kitchen  Avoid close contact with people who are sick. . Avoid places or events with large numbers of people in one location, like concerts or sporting events. . Carefully consider travel plans you have or are making. . If you are planning any travel outside or inside the [place], visit the CDC's Travelers' Health webpage for the latest health notices. . If you have some symptoms but not all symptoms, continue to monitor at home and seek medical attention  . if your symptoms worsen. . If you are having a medical emergency, call  911. >>>>>>>>>>>>>>>>>>>>>>>>>>>> Preventive Care for Adults  A healthy lifestyle and preventive care can promote health and wellness. Preventive health guidelines for men include the following key practices:  A routine yearly physical is a good way to check with your health care provider about your health and preventative screening. It is a chance to share any concerns and updates on your health and to receive a thorough exam.  Visit your dentist for a routine exam and preventative care every 6 months. Brush your teeth twice a day and floss once a day. Good oral hygiene prevents tooth decay and gum disease.  The frequency of eye exams is based on your age, health, family medical history, use of contact lenses, and other factors. Follow your health care provider's recommendations for frequency of eye exams.  Eat a healthy diet. Foods such as vegetables, fruits, whole grains, low-fat dairy products, and lean protein foods contain the nutrients you need without too many calories. Decrease your intake of foods high in solid fats, added sugars, and salt. Eat the right amount of calories for you. Get information about a proper diet from your health care provider, if necessary.  Regular physical exercise is one of the most important things you can do for your health. Most adults should get at least 150 minutes of moderate-intensity exercise (any activity that increases your heart rate and causes you to sweat) each week. In addition, most adults need muscle-strengthening exercises on 2 or more days a week.  Maintain a healthy weight. The body mass index (BMI) is a screening tool to identify possible weight problems. It provides an estimate of body fat based on height and weight. Your health care provider can find your BMI and can help you achieve or maintain a healthy weight. For adults 20 years and older:  A BMI below 18.5 is considered underweight.  A BMI of 18.5 to 24.9 is normal.  A BMI of 25 to  29.9 is considered overweight.  A BMI of 30 and above is considered obese.  Maintain normal blood lipids and cholesterol levels by exercising and minimizing your intake of saturated fat. Eat a balanced diet with plenty of fruit and vegetables. Blood tests for lipids and cholesterol should begin at age 32 and be repeated every 5 years. If your lipid or cholesterol levels are high, you are over 50, or you are at high risk for heart disease, you may need your cholesterol levels checked more frequently. Ongoing high lipid and cholesterol levels should be treated with medicines if diet and exercise are not working.  If you smoke, find out from your health care provider how to quit. If you do not use tobacco, do not start.  Lung cancer screening is recommended for adults aged 63-80 years who are at high risk for developing lung cancer because of a history of smoking. A yearly low-dose CT scan of the lungs is recommended for people who have at least a 30-pack-year  history of smoking and are a current smoker or have quit within the past 15 years. A pack year of smoking is smoking an average of 1 pack of cigarettes a day for 1 year (for example: 1 pack a day for 30 years or 2 packs a day for 15 years). Yearly screening should continue until the smoker has stopped smoking for at least 15 years. Yearly screening should be stopped for people who develop a health problem that would prevent them from having lung cancer treatment.  If you choose to drink alcohol, do not have more than 2 drinks per day. One drink is considered to be 12 ounces (355 mL) of beer, 5 ounces (148 mL) of wine, or 1.5 ounces (44 mL) of liquor.  Avoid use of street drugs. Do not share needles with anyone. Ask for help if you need support or instructions about stopping the use of drugs.  High blood pressure causes heart disease and increases the risk of stroke. Your blood pressure should be checked at least every 1-2 years. Ongoing high blood  pressure should be treated with medicines, if weight loss and exercise are not effective.  If you are 26-28 years old, ask your health care provider if you should take aspirin to prevent heart disease.  Diabetes screening involves taking a blood sample to check your fasting blood sugar level. This should be done once every 3 years, after age 105, if you are within normal weight and without risk factors for diabetes. Testing should be considered at a younger age or be carried out more frequently if you are overweight and have at least 1 risk factor for diabetes.  Colorectal cancer can be detected and often prevented. Most routine colorectal cancer screening begins at the age of 75 and continues through age 65. However, your health care provider may recommend screening at an earlier age if you have risk factors for colon cancer. On a yearly basis, your health care provider may provide home test kits to check for hidden blood in the stool. Use of a small camera at the end of a tube to directly examine the colon (sigmoidoscopy or colonoscopy) can detect the earliest forms of colorectal cancer. Talk to your health care provider about this at age 75, when routine screening begins. Direct exam of the colon should be repeated every 5-10 years through age 101, unless early forms of precancerous polyps or small growths are found.   Talk with your health care provider about prostate cancer screening.  Testicular cancer screening isrecommended for adult males. Screening includes self-exam, a health care provider exam, and other screening tests. Consult with your health care provider about any symptoms you have or any concerns you have about testicular cancer.  Use sunscreen. Apply sunscreen liberally and repeatedly throughout the day. You should seek shade when your shadow is shorter than you. Protect yourself by wearing long sleeves, pants, a wide-brimmed hat, and sunglasses year round, whenever you are  outdoors.  Once a month, do a whole-body skin exam, using a mirror to look at the skin on your back. Tell your health care provider about new moles, moles that have irregular borders, moles that are larger than a pencil eraser, or moles that have changed in shape or color.  Stay current with required vaccines (immunizations).  Influenza vaccine. All adults should be immunized every year.  Tetanus, diphtheria, and acellular pertussis (Td, Tdap) vaccine. An adult who has not previously received Tdap or who does not know his vaccine  status should receive 1 dose of Tdap. This initial dose should be followed by tetanus and diphtheria toxoids (Td) booster doses every 10 years. Adults with an unknown or incomplete history of completing a 3-dose immunization series with Td-containing vaccines should begin or complete a primary immunization series including a Tdap dose. Adults should receive a Td booster every 10 years.  Varicella vaccine. An adult without evidence of immunity to varicella should receive 2 doses or a second dose if he has previously received 1 dose.  Human papillomavirus (HPV) vaccine. Males aged 52-21 years who have not received the vaccine previously should receive the 3-dose series. Males aged 22-26 years may be immunized. Immunization is recommended through the age of 35 years for any male who has sex with males and did not get any or all doses earlier. Immunization is recommended for any person with an immunocompromised condition through the age of 23 years if he did not get any or all doses earlier. During the 3-dose series, the second dose should be obtained 4-8 weeks after the first dose. The third dose should be obtained 24 weeks after the first dose and 16 weeks after the second dose.  Zoster vaccine. One dose is recommended for adults aged 23 years or older unless certain conditions are present.    PREVNAR  - Pneumococcal 13-valent conjugate (PCV13) vaccine. When indicated, a  person who is uncertain of his immunization history and has no record of immunization should receive the PCV13 vaccine. An adult aged 62 years or older who has certain medical conditions and has not been previously immunized should receive 1 dose of PCV13 vaccine. This PCV13 should be followed with a dose of pneumococcal polysaccharide (PPSV23) vaccine. The PPSV23 vaccine dose should be obtained at least 1 r more year(s) after the dose of PCV13 vaccine. An adult aged 76 years or older who has certain medical conditions and previously received 1 or more doses of PPSV23 vaccine should receive 1 dose of PCV13. The PCV13 vaccine dose should be obtained 1 or more years after the last PPSV23 vaccine dose.    PNEUMOVAX - Pneumococcal polysaccharide (PPSV23) vaccine. When PCV13 is also indicated, PCV13 should be obtained first. All adults aged 43 years and older should be immunized. An adult younger than age 41 years who has certain medical conditions should be immunized. Any person who resides in a nursing home or long-term care facility should be immunized. An adult smoker should be immunized. People with an immunocompromised condition and certain other conditions should receive both PCV13 and PPSV23 vaccines. People with human immunodeficiency virus (HIV) infection should be immunized as soon as possible after diagnosis. Immunization during chemotherapy or radiation therapy should be avoided. Routine use of PPSV23 vaccine is not recommended for American Indians, [place] Natives, or people younger than 65 years unless there are medical conditions that require PPSV23 vaccine. When indicated, people who have unknown immunization and have no record of immunization should receive PPSV23 vaccine. One-time revaccination 5 years after the first dose of PPSV23 is recommended for people aged 19-64 years who have chronic kidney failure, nephrotic syndrome, asplenia, or immunocompromised conditions. People who received 1-2 doses  of PPSV23 before age 70 years should receive another dose of PPSV23 vaccine at age 31 years or later if at least 5 years have passed since the previous dose. Doses of PPSV23 are not needed for people immunized with PPSV23 at or after age 72 years.    Hepatitis A vaccine. Adults who wish to be  protected from this disease, have certain high-risk conditions, work with hepatitis A-infected animals, work in hepatitis A research labs, or travel to or work in countries with a high rate of hepatitis A should be immunized. Adults who were previously unvaccinated and who anticipate close contact with an international adoptee during the first 60 days after arrival in the [place] from a country with a high rate of hepatitis A should be immunized.    Hepatitis B vaccine. Adults should be immunized if they wish to be protected from this disease, have certain high-risk conditions, may be exposed to blood or other infectious body fluids, are household contacts or sex partners of hepatitis B positive people, are clients or workers in certain care facilities, or travel to or work in countries with a high rate of hepatitis B.   Preventive Service / Frequency   Ages 72 to 62  Blood pressure check.  Lipid and cholesterol check  Lung cancer screening. / Every year if you are aged 108-80 years and have a 30-pack-year history of smoking and currently smoke or have quit within the past 15 years. Yearly screening is stopped once you have quit smoking for at least 15 years or develop a health problem that would prevent you from having lung cancer treatment.  Fecal occult blood test (FOBT) of stool. / Every year beginning at age 72 and continuing until age 51. You may not have to do this test if you get a colonoscopy every 10 years.  Flexible sigmoidoscopy** or colonoscopy.** / Every 5 years for a flexible sigmoidoscopy or every 10 years for a colonoscopy beginning at age 6 and continuing until age 35. Screening  for abdominal aortic aneurysm (AAA)  by ultrasound is recommended for people who have history of high blood pressure or who are current or former smokers. +++++++++++ Recommend Adult Low Dose Aspirin or  coated  Aspirin 81 mg daily  To reduce risk of Colon Cancer 40 %,  Skin Cancer 26 % ,  Malignant Melanoma 46%  and  Pancreatic cancer 60% ++++++++++++++++++++ Vitamin D goal  is between 70-100.  Please make sure that you are taking your Vitamin D as directed.  It is very important as a natural anti-inflammatory  helping hair, skin, and nails, as well as reducing stroke and heart attack risk.  It helps your bones and helps with mood. It also decreases numerous cancer risks so please take it as directed.  Low Vit D is associated with a 200-300% higher risk for CANCER  and 200-300% higher risk for HEART   ATTACK  &  STROKE.   .....................................Marland Kitchen It is also associated with higher death rate at younger ages,  autoimmune diseases like Rheumatoid arthritis, Lupus, Multiple Sclerosis.    Also many other serious conditions, like depression, Alzheimer's Dementia, infertility, muscle aches, fatigue, fibromyalgia - just to name a few. +++++++++++++++++++++ Recommend the book "The END of DIETING" by Dr Excell Seltzer  & the book "The END of DIABETES " by Dr Excell Seltzer At [email or web address] - get book & Audio CD's    Being diabetic has a  300% increased risk for heart attack, stroke, cancer, and alzheimer- type vascular dementia. It is very important that you work harder with diet by avoiding all foods that are white. Avoid white rice (brown & wild rice is OK), white potatoes (sweetpotatoes in moderation is OK), White bread or wheat bread or anything made out of white flour like bagels, donuts, rolls, buns, biscuits, cakes, pastries, cookies, pizza  crust, and pasta (made from white flour & egg whites) - vegetarian pasta or spinach or wheat pasta is OK. Multigrain breads like Arnold's or  [employer], or multigrain sandwich thins or flatbreads.  Diet, exercise and weight loss can reverse and cure diabetes in the early stages.  Diet, exercise and weight loss is very important in the control and prevention of complications of diabetes which affects every system in your body, ie. Brain - dementia/stroke, eyes - glaucoma/blindness, heart - heart attack/heart failure, kidneys - dialysis, stomach - gastric paralysis, intestines - malabsorption, nerves - severe painful neuritis, circulation - gangrene & loss of a leg(s), and finally cancer and Alzheimers.    I recommend avoid fried & greasy foods,  sweets/candy, white rice (brown or wild rice or Quinoa is OK), white potatoes (sweet potatoes are OK) - anything made from white flour - bagels, doughnuts, rolls, buns, biscuits,white and wheat breads, pizza crust and traditional pasta made of white flour & egg white(vegetarian pasta or spinach or wheat pasta is OK).  Multi-grain bread is OK - like multi-grain flat bread or sandwich thins. Avoid alcohol in excess. Exercise is also important.    Eat all the vegetables you want - avoid meat, especially red meat and dairy - especially cheese.  Cheese is the most concentrated form of trans-fats which is the worst thing to clog up our arteries. Veggie cheese is OK which can be found in the fresh produce section at [employer] or [employer] or [employer]  ++++++++++++++++++++++ DASH Eating Plan  DASH stands for "Dietary Approaches to Stop Hypertension."   The DASH eating plan is a healthy eating plan that has been shown to reduce high blood pressure (hypertension). Additional health benefits may include reducing the risk of type 2 diabetes mellitus, heart disease, and stroke. The DASH eating plan may also help with weight loss. WHAT DO I NEED TO KNOW ABOUT THE DASH EATING PLAN? For the DASH eating plan, you will follow these general guidelines:  Choose foods with a percent daily value for sodium of  less than 5% (as listed on the food label).  Use salt-free seasonings or herbs instead of table salt or sea salt.  Check with your health care provider or pharmacist before using salt substitutes.  Eat lower-sodium products, often labeled as "lower sodium" or "no salt added."  Eat fresh foods.  Eat more vegetables, fruits, and low-fat dairy products.  Choose whole grains. Look for the word "whole" as the first word in the ingredient list.  Choose fish   Limit sweets, desserts, sugars, and sugary drinks.  Choose heart-healthy fats.  Eat veggie cheese   Eat more home-cooked food and less restaurant, buffet, and fast food.  Limit fried foods.  Cook foods using methods other than frying.  Limit canned vegetables. If you do use them, rinse them well to decrease the sodium.  When eating at a restaurant, ask that your food be prepared with less salt, or no salt if possible.                      WHAT FOODS CAN I EAT? Read Dr Fara Olden Fuhrman's books on The End of Dieting & The End of Diabetes  Grains Whole grain or whole wheat bread. Brown rice. Whole grain or whole wheat pasta. Quinoa, bulgur, and whole grain cereals. Low-sodium cereals. Corn or whole wheat flour tortillas. Whole grain cornbread. Whole grain crackers. Low-sodium crackers.  Vegetables Fresh or frozen vegetables (raw, steamed, roasted,  or grilled). Low-sodium or reduced-sodium tomato and vegetable juices. Low-sodium or reduced-sodium tomato sauce and paste. Low-sodium or reduced-sodium canned vegetables.   Fruits All fresh, canned (in natural juice), or frozen fruits.  Protein Products  All fish and seafood.  Dried beans, peas, or lentils. Unsalted nuts and seeds. Unsalted canned beans.  Dairy Low-fat dairy products, such as skim or 1% milk, 2% or reduced-fat cheeses, low-fat ricotta or cottage cheese, or plain low-fat yogurt. Low-sodium or reduced-sodium cheeses.  Fats and Oils Tub margarines without trans  fats. Light or reduced-fat mayonnaise and salad dressings (reduced sodium). Avocado. Safflower, olive, or canola oils. Natural peanut or almond butter.  Other Unsalted popcorn and pretzels. The items listed above may not be a complete list of recommended foods or beverages. Contact your dietitian for more options.  +++++++++++++++++++  WHAT FOODS ARE NOT RECOMMENDED? Grains/ White flour or wheat flour White bread. White pasta. White rice. Refined cornbread. Bagels and croissants. Crackers that contain trans fat.  Vegetables  Creamed or fried vegetables. Vegetables in a . Regular canned vegetables. Regular canned tomato sauce and paste. Regular tomato and vegetable juices.  Fruits Dried fruits. Canned fruit in light or heavy syrup. Fruit juice.  Meat and Other Protein Products Meat in general - RED meat & White meat.  Fatty cuts of meat. Ribs, chicken wings, all processed meats as bacon, sausage, bologna, salami, fatback, hot dogs, bratwurst and packaged luncheon meats.  Dairy Whole or 2% milk, cream, half-and-half, and cream cheese. Whole-fat or sweetened yogurt. Full-fat cheeses or blue cheese. Non-dairy creamers and whipped toppings. Processed cheese, cheese spreads, or cheese curds.  Condiments Onion and garlic salt, seasoned salt, table salt, and sea salt. Canned and packaged gravies. Worcestershire sauce. Tartar sauce. Barbecue sauce. Teriyaki sauce. Soy sauce, including reduced sodium. Steak sauce. Fish sauce. Oyster sauce. Cocktail sauce. Horseradish. Ketchup and mustard. Meat flavorings and tenderizers. Bouillon cubes. Hot sauce. Tabasco sauce. Marinades. Taco seasonings. Relishes.  Fats and Oils Butter, stick margarine, lard, shortening and bacon fat. Coconut, palm kernel, or palm oils. Regular salad dressings.  Pickles and olives. Salted popcorn and pretzels.  The items listed above may not be a complete list of foods and beverages to avoid.

## 2019-09-13 NOTE — Progress Notes (Signed)
Annual  Screening/Preventative Visit  & Comprehensive Evaluation & Examination     This very nice 59 y.o.  MWM presents for a Screening /Preventative Visit & comprehensive evaluation and management of multiple medical co-morbidities.  Patient has been followed for HTN, HLD, Prediabetes and Vitamin D Deficiency.  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 He had original abdominal surgery at [facility] in [place]. He reports having gradually worsening of "herniation " of the LLQ Abdominal muscles & desires surgical consultation as to whether any surgical procedures / revision might help the diastasis around his colostomy site. Usually irrigates qod & changes the "colostomy seal" every 5 days. Last colonoscopy 12/2016 w/Dr Ardis Hughs & next scheduled for 5 years -May 2023.        HTN predates since 2012. Patient's BP has been controlled at home.  Today's BP is at goal - 116/70. Patient denies any cardiac symptoms as chest pain, palpitations, shortness of breath, dizziness or ankle swelling.     Patient's hyperlipidemia is controlled with diet and Crestor. Patient denies myalgias or other medication SE's. Last lipids were at goal:  Lab Results  Component Value Date   CHOL 147 12/22/2018   HDL 46 12/22/2018   LDLCALC 80 12/22/2018   TRIG 115 12/22/2018   CHOLHDL 3.2 12/22/2018      Patient has moderate obesity (BMI 33+) and hx/o prediabetes (A1c 5.7% / 2012) and patient denies reactive hypoglycemic symptoms, visual blurring, diabetic polys or paresthesias. Last A1c was Normal & at goal:  Lab Results  Component Value Date   HGBA1C 5.6 12/22/2018       Patient has hx/o Low Testosterone  ("260" / 2012)  and is on replacement by gel.     Finally, patient has history of Vitamin D Deficiency ("14" / 2009)  and last vitamin D was at goal:  Lab Results  Component Value Date   VD25OH 81 12/22/2018   Current Outpatient Medications on File Prior to Visit  Medication Sig    . aspirin 81 MG tablet Take 81 mg by mouth daily.  . IRON PO Take 500 mg by mouth every other day.  . LOTEMAX 0.5 % GEL   . rosuvastatin (CRESTOR) 40 MG tablet Take 1/2 to 1 tablet Daily for Cholesterol  . sildenafil (VIAGRA) 100 MG tablet Take 1/2 to 1 tablet Daily as needed for XXXX  . Testosterone 20.25 MG/ACT (1.62%) GEL Apply 2 pumps Daily to the Skin  . vitamin B-12 (CYANOCOBALAMIN) 500 MCG tablet Take 500 mcg by mouth daily.  Marland Kitchen VITAMIN D PO Take 5,000 Units by mouth 2 (two) times daily.  Marland Kitchen zinc gluconate 50 MG tablet Take 50 mg by mouth daily.   No current facility-administered medications on file prior to visit.   Allergies  Allergen Reactions  . Oxycodone Nausea And Vomiting   Past Medical History:  Diagnosis Date  . Arthritis   . Hyperlipidemia   . Hypertension   . Hypogonadism male   . Pre-diabetes   . 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 TETANUS/TDAP  02/16/2018  . INFLUENZA VACCINE  04/02/2019  . COLONOSCOPY  10/21/2021  . Hepatitis C Screening  Completed  . HIV Screening  Completed   Immunization History  Administered Date(s) Administered  . PPD Test 09/09/2013, 09/11/2014, 09/20/2015, 04/16/2017, 05/18/2018  . Tdap 02/17/2008   Last Colon - 10/21/2016 - Dr Ardis Hughs - 5 yr  F/u due 10/2021.  Past Surgical History:  Procedure Laterality Date  . COLON SURGERY N/A 2009   AP Rectal Resection  . colonoscopsy  09/2011  . HERNIA REPAIR    . RECONSTRUCT / STABILIZE DISTAL ULNA    . WISDOM TOOTH EXTRACTION     Family History  Problem Relation Age of Onset  . COPD Mother   . Alcohol abuse Father   . Cirrhosis Father   . COPD Sister   . Alcohol abuse Brother   . Diabetes Maternal Grandmother   . Colon cancer Neg Hx    Social History   Socioeconomic History  . Marital status: Married    Spouse name: Cecille Rubin  . Number of children: 3 sons & 2 GC  Occupational History  . Sales   Tobacco Use  . Smoking  status: Never Smoker  . Smokeless tobacco: Never Used  Substance and Sexual Activity  . Alcohol use: Yes    Alcohol/week: 4.0 standard drinks    Types: 4 Glasses of wine per week    Comment: 4  . Drug use: No  . Sexual activity: Not on file     ROS Constitutional: Denies fever, chills, weight loss/gain, headaches, insomnia,  night sweats or change in appetite. Does c/o fatigue. Eyes: Denies redness, blurred vision, diplopia, discharge, itchy or watery eyes.  ENT: Denies discharge, congestion, post nasal drip, epistaxis, sore throat, earache, hearing loss, dental pain, Tinnitus, Vertigo, Sinus pain or snoring.  Cardio: Denies chest pain, palpitations, irregular heartbeat, syncope, dyspnea, diaphoresis, orthopnea, PND, claudication or edema Respiratory: denies cough, dyspnea, DOE, pleurisy, hoarseness, laryngitis or wheezing.  Gastrointestinal: Denies dysphagia, heartburn, reflux, water brash, pain, cramps, nausea, vomiting, bloating, diarrhea, constipation, hematemesis, melena, hematochezia, jaundice or hemorrhoids Genitourinary: Denies dysuria, frequency, urgency, nocturia, hesitancy, discharge, hematuria or flank pain Musculoskeletal: Denies arthralgia, myalgia, stiffness, Jt. Swelling, pain, limp or strain/sprain. Denies Falls. Skin: Denies puritis, rash, hives, warts, acne, eczema or change in skin lesion Neuro: No weakness, tremor, incoordination, spasms, paresthesia or pain Psychiatric: Denies confusion, memory loss or sensory loss. Denies Depression. Endocrine: Denies change in weight, skin, hair change, nocturia, and paresthesia, diabetic polys, visual blurring or hyper / hypo glycemic episodes.  Heme/Lymph: No excessive bleeding, bruising or enlarged lymph nodes.  Physical Exam  BP 116/70   Pulse 72   Temp (!) 97.1 F (36.2 C)   Resp 16   Ht 5\' 8"  (1.727 m)   Wt 219 lb 3.2 oz (99.4 kg)   BMI 33.33 kg/m   General Appearance: Well nourished and well groomed and in no  apparent distress.  Eyes: PERRLA, EOMs, conjunctiva no swelling or erythema, normal fundi and vessels. Sinuses: No frontal/maxillary tenderness ENT/Mouth: EACs patent / TMs  nl. Nares clear without erythema, swelling, mucoid exudates. Oral hygiene is good. No erythema, swelling, or exudate. Tongue normal, non-obstructing. Tonsils not swollen or erythematous. Hearing normal.  Neck: Supple, thyroid not palpable. No bruits, nodes or JVD. Respiratory: Respiratory effort normal.  BS equal and clear bilateral without rales, rhonci, wheezing or stridor. Cardio: Heart sounds are normal with regular rate and rhythm and no murmurs, rubs or gallops. Peripheral pulses are normal and equal bilaterally without edema. No aortic or femoral bruits. Chest: symmetric with normal excursions and percussion.  Abdomen: Soft, with Nl bowel sounds. Nontender, no guarding, rebound, masses, or organomegaly. Moderate diastasis or laxity of the abdominal musculature. Lymphatics: Non tender without lymphadenopathy.  Musculoskeletal: Full ROM all peripheral extremities, joint stability, 5/5 strength, and normal gait. Skin:  Warm and dry without rashes, lesions, cyanosis, clubbing or  ecchymosis.  Neuro: Cranial nerves intact, reflexes equal bilaterally. Normal muscle tone, no cerebellar symptoms. Sensation intact.  Pysch: Alert and oriented X 3 with normal affect, insight and judgment appropriate.   Assessment and Plan  1. Annual Preventative/Screening Exam   1. Encounter for general adult medical examination with abnormal findings   2. Essential hypertension  - EKG 12-Lead - [place], retroperitnl abd,  ltd - Urinalysis, Routine w reflex microscopic - Microalbumin / Creatinine Urine Ratio - CBC with Diff - COMPLETE METABOLIC PANEL WITH GFR - Magnesium - TSH  3. Hyperlipidemia, mixed  - EKG 12-Lead - [place], retroperitnl abd,  ltd - Lipid Profile - TSH  4. Abnormal glucose  - EKG 12-Lead - [place], retroperitnl abd,   ltd - Hemoglobin A1c ([facility]) - Insulin, random  5. Vitamin D deficiency  - Vitamin D (25 hydroxy) - VITAMIN D PO; Take 5,000 Units by mouth 2 (two) times daily.  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 Idiopathic gout, unspecified chronicity, unspecified site  - Uric acid  8. Testosterone deficiency  - Testosterone, Total  9. Screening for colorectal cancer  - POC Hemoccult Bld/Stl (3-Cd Home Screen); Future  10. BPH with obstruction/lower urinary tract symptoms  - PSA   11. Prostate cancer screening  - PSA  12. Screening for ischemic heart disease  - EKG 12-Lead  13. Screening for AAA (aortic abdominal aneurysm)  - [place], retroperitnl abd,  ltd  14. Fatigue  - Iron,Total/Total Iron Binding Cap - Vitamin B12 - CBC with Diff - TSH  15. Medication management  - Urinalysis, Routine w reflex microscopic - Microalbumin / Creatinine Urine Ratio - CBC with Diff - COMPLETE METABOLIC PANEL WITH GFR - Magnesium - Lipid Profile - TSH - Hemoglobin A1c ([facility]) - Insulin, random - tadalafil (CIALIS) 20 MG tablet; Take 1/2 to 1 tablet every 2 to 3 days as needed for XXXX  Dispense: 30 tablet; Refill: 11           Patient was counseled in prudent diet, weight control to achieve/maintain BMI less than 25, BP monitoring, regular exercise and medications as discussed.  Discussed med effects and SE's. Routine screening labs and tests as requested with regular follow-up as recommended. Over 40 minutes of exam, counseling, chart review and high complex critical decision making was performed   Kirtland Bouchard, MD

## 2019-09-14 ENCOUNTER — Other Ambulatory Visit: Payer: Self-pay | Admitting: Internal Medicine

## 2019-09-14 DIAGNOSIS — N289 Disorder of kidney and ureter, unspecified: Secondary | ICD-10-CM

## 2019-09-14 LAB — CBC WITH DIFFERENTIAL/PLATELET
Absolute Monocytes: 469 cells/uL (ref 200–950)
Basophils Absolute: 27 cells/uL (ref 0–200)
Basophils Relative: 0.4 %
Eosinophils Absolute: 170 cells/uL (ref 15–500)
Eosinophils Relative: 2.5 %
HCT: 44.8 % (ref 38.5–50.0)
Hemoglobin: 15.1 g/dL (ref 13.2–17.1)
Lymphs Abs: 2033 cells/uL (ref 850–3900)
MCH: 27.8 pg (ref 27.0–33.0)
MCHC: 33.7 g/dL (ref 32.0–36.0)
MCV: 82.5 fL (ref 80.0–100.0)
MPV: 11.5 fL (ref 7.5–12.5)
Monocytes Relative: 6.9 %
Neutro Abs: 4100 cells/uL (ref 1500–7800)
Neutrophils Relative %: 60.3 %
Platelets: 200 10*3/uL (ref 140–400)
RBC: 5.43 10*6/uL (ref 4.20–5.80)
RDW: 13.6 % (ref 11.0–15.0)
Total Lymphocyte: 29.9 %
WBC: 6.8 10*3/uL (ref 3.8–10.8)

## 2019-09-14 LAB — COMPLETE METABOLIC PANEL WITH GFR
AG Ratio: 2.1 (calc) (ref 1.0–2.5)
ALT: 26 U/L (ref 9–46)
AST: 18 U/L (ref 10–35)
Albumin: 4.4 g/dL (ref 3.6–5.1)
Alkaline phosphatase (APISO): 46 U/L (ref 35–144)
BUN/Creatinine Ratio: 11 (calc) (ref 6–22)
BUN: 19 mg/dL (ref 7–25)
CO2: 28 mmol/L (ref 20–32)
Calcium: 9.9 mg/dL (ref 8.6–10.3)
Chloride: 103 mmol/L (ref 98–110)
Creat: 1.73 mg/dL — ABNORMAL HIGH (ref 0.70–1.25)
GFR, Est African American: 49 mL/min/{1.73_m2} — ABNORMAL LOW (ref >=60)
GFR, Est Non African American: 42 mL/min/{1.73_m2} — ABNORMAL LOW (ref >=60)
Globulin: 2.1 g/dL (calc) (ref 1.9–3.7)
Glucose, Bld: 95 mg/dL (ref 65–99)
Potassium: 4.6 mmol/L (ref 3.5–5.3)
Sodium: 140 mmol/L (ref 135–146)
Total Bilirubin: 0.7 mg/dL (ref 0.2–1.2)
Total Protein: 6.5 g/dL (ref 6.1–8.1)

## 2019-09-14 LAB — VITAMIN D 25 HYDROXY (VIT D DEFICIENCY, FRACTURES): Vit D, 25-Hydroxy: 76 ng/mL (ref 30–100)

## 2019-09-14 LAB — VITAMIN B12: Vitamin B-12: 1583 pg/mL — ABNORMAL HIGH (ref 200–1100)

## 2019-09-14 LAB — HEMOGLOBIN A1C
Hgb A1c MFr Bld: 5.6 % of total Hgb (ref <5.7)
Mean Plasma Glucose: 114 (calc)
eAG (mmol/L): 6.3 (calc)

## 2019-09-14 LAB — URINALYSIS, ROUTINE W REFLEX MICROSCOPIC
Bilirubin Urine: NEGATIVE
Glucose, UA: NEGATIVE
Hgb urine dipstick: NEGATIVE
Leukocytes,Ua: NEGATIVE
Nitrite: NEGATIVE
Protein, ur: NEGATIVE
Specific Gravity, Urine: 1.021 (ref 1.001–1.03)
pH: 6 (ref 5.0–8.0)

## 2019-09-14 LAB — TESTOSTERONE: Testosterone: 97 ng/dL — ABNORMAL LOW (ref 250–827)

## 2019-09-14 LAB — MICROALBUMIN / CREATININE URINE RATIO
Creatinine, Urine: 184 mg/dL (ref 20–320)
Microalb Creat Ratio: 2 mcg/mg creat (ref <30)
Microalb, Ur: 0.4 mg/dL

## 2019-09-14 LAB — MAGNESIUM: Magnesium: 2.2 mg/dL (ref 1.5–2.5)

## 2019-09-14 LAB — LIPID PANEL
Cholesterol: 157 mg/dL (ref <200)
HDL: 47 mg/dL (ref >=40)
LDL Cholesterol (Calc): 76 mg/dL (calc)
Non-HDL Cholesterol (Calc): 110 mg/dL (calc) (ref <130)
Total CHOL/HDL Ratio: 3.3 (calc) (ref <5.0)
Triglycerides: 245 mg/dL — ABNORMAL HIGH (ref <150)

## 2019-09-14 LAB — IRON, TOTAL/TOTAL IRON BINDING CAP
%SAT: 24 % (calc) (ref 20–48)
Iron: 65 ug/dL (ref 50–180)
TIBC: 272 mcg/dL (calc) (ref 250–425)

## 2019-09-14 LAB — PSA: PSA: 0.3 ng/mL (ref <=4.0)

## 2019-09-14 LAB — INSULIN, RANDOM: Insulin: 25.7 u[IU]/mL — ABNORMAL HIGH

## 2019-09-14 LAB — URIC ACID: Uric Acid, Serum: 7.2 mg/dL (ref 4.0–8.0)

## 2019-09-14 LAB — TSH: TSH: 1.57 mIU/L (ref 0.40–4.50)

## 2019-09-29 ENCOUNTER — Other Ambulatory Visit: Payer: Self-pay | Admitting: Internal Medicine

## 2019-09-29 DIAGNOSIS — E782 Mixed hyperlipidemia: Secondary | ICD-10-CM

## 2019-10-17 ENCOUNTER — Encounter: Payer: Self-pay | Admitting: Surgery

## 2019-10-18 DIAGNOSIS — K4090 Unilateral inguinal hernia, without obstruction or gangrene, not specified as recurrent: Secondary | ICD-10-CM | POA: Diagnosis not present

## 2019-10-18 DIAGNOSIS — Z85048 Personal history of other malignant neoplasm of rectum, rectosigmoid junction, and anus: Secondary | ICD-10-CM | POA: Diagnosis not present

## 2019-10-18 DIAGNOSIS — K435 Parastomal hernia without obstruction or  gangrene: Secondary | ICD-10-CM | POA: Diagnosis not present

## 2019-10-18 DIAGNOSIS — K432 Incisional hernia without obstruction or gangrene: Secondary | ICD-10-CM | POA: Diagnosis not present

## 2019-12-20 NOTE — Progress Notes (Deleted)
FOLLOW UP  Assessment and Plan:   Hypertension Previously well controlled; he has stopped taking medication - encouraged to restart - discussed goal of 130/80 *** Monitor blood pressure at home; patient to call if consistently greater than 130/80 Continue DASH diet.   Reminder to go to the ER if any CP, SOB, nausea, dizziness, severe HA, changes vision/speech, left arm numbness and tingling and jaw pain. Keep close follow up in March.   Cholesterol Currently at goal; continue rosuvastatin 20 mg daily  Continue low cholesterol diet and exercise.  Check lipid panel.   Other abnormal glucose Last several A1Cs at goal Continue diet and exercise.  Perform daily foot/skin check, notify office of any concerning changes.  Defer A1C; check CMP/GFR  Obesity with co morbidities Long discussion about weight loss, diet, and exercise Recommended diet heavy in fruits and veggies and low in animal meats, cheeses, and dairy products, appropriate calorie intake Discussed ideal weight for height, end goal of 170lb, initial goal of 210lb set He will work on cutting portions, sweets, splurge meals Will follow up in 3 months  Vitamin D Def At goal at last visit; continue supplementation to maintain goal of 70-100 Defer Vit D level  Hypogonadism - continue replacement therapy, check testosterone levels as needed.   Colostomy [facility]) Doing well without complications.   Continue diet and meds as discussed. Further disposition pending results of labs. Discussed med's effects and SE's.   Over 30 minutes of exam, counseling, chart review, and critical decision making was performed.   Future Appointments  Date Time Provider [place]  12/21/2019  3:30 PM Liane Comber, NP [facility] None  03/27/2020  2:30 PM [name], MD [facility] None  09/12/2020  9:00 AM [name], MD [facility] None     ----------------------------------------------------------------------------------------------------------------------  HPI 59 y.o. male  presents for 3 month follow up on hypertension, cholesterol, prediabetes, hypogonadism on testosterone supplementations, obesity and vitamin D deficiency.  He has hx of rectal cancer with resection and colostomy in 2009 - he is doing well since.   BMI is There is no height or weight on file to calculate BMI., he has been working on diet and exercise, admits he gained a bit over the holidays. He is working on cutting on portions and sweets. Walks daily and is pushing vegetables, cutting down on red meats and dairy.  Wt Readings from Last 3 Encounters:  09/13/19 219 lb 3.2 oz (99.4 kg)  12/22/18 215 lb (97.5 kg)  09/17/18 222 lb 9.6 oz (101 kg)   He has been checking his BP at home, stopped taking medication, today their BP is    He does workout - he walks 30 mins daily.  He denies chest pain, shortness of breath, dizziness.   He is on cholesterol medication - rosuvastatin 20 mg daily - and denies myalgias. His LDL cholesterol is at goal. The cholesterol last visit was:   Lab Results  Component Value Date   CHOL 157 09/13/2019   HDL 47 09/13/2019   LDLCALC 76 09/13/2019   TRIG 245 (H) 09/13/2019   CHOLHDL 3.3 09/13/2019    He has been working on diet and exercise for glucose management, and denies increased appetite, nausea, paresthesia of the feet, polydipsia, polyuria, visual disturbances and vomiting. Last A1C in the office was:  Lab Results  Component Value Date   HGBA1C 5.6 09/13/2019   Patient is on Vitamin D supplement and at goal at the last check:    Lab Results  Component Value Date   VD25OH 65 09/13/2019     He has a history of testosterone deficiency and is on testosterone replacement. *** He states that the testosterone helps with his energy, libido, muscle mass. Lab Results  Component Value Date   TESTOSTERONE 97 (L) 09/13/2019    Patient has dx of gout but not on allopurinol in last few years and does not report a recent flare, he has worked on lifestyle modification (avoiding processed meats and limiting alcohol).  Lab Results  Component Value Date   LABURIC 7.2 09/13/2019    Current Medications:  Current Outpatient Medications on File Prior to Visit  Medication Sig  . aspirin 81 MG tablet Take 81 mg by mouth daily.  . IRON PO Take 500 mg by mouth every other day.  . LOTEMAX 0.5 % GEL   . rosuvastatin (CRESTOR) 40 MG tablet Take 1 tablet Daily for Cholesterol  . sildenafil (VIAGRA) 100 MG tablet Take 1/2 to 1 tablet Daily as needed for XXXX  . tadalafil (CIALIS) 20 MG tablet Take 1/2 to 1 tablet every 2 to 3 days as needed for XXXX  . Testosterone 20.25 MG/ACT (1.62%) GEL Apply 2 pumps Daily to the Skin  . vitamin B-12 (CYANOCOBALAMIN) 500 MCG tablet Take 500 mcg by mouth daily.  Marland Kitchen VITAMIN D PO Take 5,000 Units by mouth 2 (two) times daily.  Marland Kitchen zinc gluconate 50 MG tablet Take 50 mg by mouth daily.   No current facility-administered medications on file prior to visit.     Allergies:  Allergies  Allergen Reactions  . Oxycodone Nausea And Vomiting     Medical History:  Past Medical History:  Diagnosis Date  . Arthritis   . Hyperlipidemia   . Hypertension   . Hypogonadism male   . Pre-diabetes   . Rectal adenocarcinoma ([place]) 2009   colostomy   Family history- Reviewed and unchanged Social history- Reviewed and unchanged   Review of Systems:  Review of Systems  Constitutional: Negative for malaise/fatigue and weight loss.  HENT: Positive for tinnitus (Ongoing chronic, bilateral, improved with hearing aids). Negative for hearing loss.   Eyes: Negative for blurred vision and double vision.  Respiratory: Negative for cough, shortness of breath and wheezing.   Cardiovascular: Negative for chest pain, palpitations, orthopnea, claudication and leg swelling.  Gastrointestinal: Negative for  abdominal pain, blood in stool, constipation, diarrhea, heartburn, melena, nausea and vomiting.  Genitourinary: Negative.   Musculoskeletal: Negative for joint pain and myalgias.  Skin: Negative for rash.  Neurological: Negative for dizziness, tingling, sensory change, weakness and headaches.  Endo/Heme/Allergies: Negative for polydipsia.  Psychiatric/Behavioral: Negative.   All other systems reviewed and are negative.   Physical Exam: There were no vitals taken for this visit. Wt Readings from Last 3 Encounters:  09/13/19 219 lb 3.2 oz (99.4 kg)  12/22/18 215 lb (97.5 kg)  09/17/18 222 lb 9.6 oz (101 kg)   General Appearance: Well nourished, in no apparent distress. Eyes: PERRLA, EOMs, conjunctiva no swelling or erythema Sinuses: No Frontal/maxillary tenderness ENT/Mouth: Ext aud canals clear, TMs without erythema, bulging. No erythema, swelling, or exudate on post pharynx.  Tonsils not swollen or erythematous. With bilateral hearing aids.  Neck: Supple, thyroid normal.  Respiratory: Respiratory effort normal, BS equal bilaterally without rales, rhonchi, wheezing or stridor.  Cardio: RRR with no MRGs. Brisk peripheral pulses without edema.  Abdomen: Soft, + BS.  Non tender, no guarding, rebound, hernias, masses. Colostomy bag present to left without ulcers, skin  breakdown, purulent discharge/mucus.  Lymphatics: Non tender without lymphadenopathy.  Musculoskeletal: Full ROM, 5/5 strength, Normal gait Skin: Warm, dry without rashes, lesions, ecchymosis.  Neuro: Cranial nerves intact. No cerebellar symptoms.  Psych: Awake and oriented X 3, normal affect, Insight and Judgment appropriate.   Izora Ribas, NP 5:26 PM [facility]

## 2019-12-21 ENCOUNTER — Ambulatory Visit: Payer: BC Managed Care – PPO | Admitting: Adult Health

## 2019-12-21 DIAGNOSIS — E785 Hyperlipidemia, unspecified: Secondary | ICD-10-CM

## 2019-12-21 DIAGNOSIS — Z79899 Other long term (current) drug therapy: Secondary | ICD-10-CM

## 2019-12-21 DIAGNOSIS — Z933 Colostomy status: Secondary | ICD-10-CM

## 2019-12-21 DIAGNOSIS — M1000 Idiopathic gout, unspecified site: Secondary | ICD-10-CM

## 2019-12-21 DIAGNOSIS — R7309 Other abnormal glucose: Secondary | ICD-10-CM

## 2019-12-21 DIAGNOSIS — I10 Essential (primary) hypertension: Secondary | ICD-10-CM

## 2019-12-21 DIAGNOSIS — E559 Vitamin D deficiency, unspecified: Secondary | ICD-10-CM

## 2019-12-21 DIAGNOSIS — E669 Obesity, unspecified: Secondary | ICD-10-CM

## 2019-12-21 DIAGNOSIS — E349 Endocrine disorder, unspecified: Secondary | ICD-10-CM

## 2019-12-27 ENCOUNTER — Other Ambulatory Visit: Payer: Self-pay | Admitting: Internal Medicine

## 2019-12-27 ENCOUNTER — Telehealth: Payer: Self-pay | Admitting: *Deleted

## 2019-12-27 NOTE — Telephone Encounter (Signed)
Due to the patient being negative for gout at his January physical and is not taking gout medication regularly, the patient was advised he will need OV to be tested for gout. A message was left.

## 2019-12-28 ENCOUNTER — Telehealth: Payer: Self-pay | Admitting: *Deleted

## 2019-12-28 NOTE — Telephone Encounter (Signed)
A message was left to inform the patient he has a 3 month follow up on 01/02/2020, but if he is still having gout pain, he can call to move the appointment to an earlier day.

## 2019-12-29 NOTE — Progress Notes (Deleted)
FOLLOW UP  Assessment and Plan:   Hypertension Previously well controlled; he has stopped taking medication - encouraged to restart - discussed goal of 130/80 *** Monitor blood pressure at home; patient to call if consistently greater than 130/80 Continue DASH diet.   Reminder to go to the ER if any CP, SOB, nausea, dizziness, severe HA, changes vision/speech, left arm numbness and tingling and jaw pain. Keep close follow up in March.   Cholesterol Currently at goal; continue rosuvastatin 20 mg daily  Continue low cholesterol diet and exercise.  Check lipid panel.   Other abnormal glucose Last several A1Cs at goal Continue diet and exercise.  Perform daily foot/skin check, notify office of any concerning changes.  Defer A1C; check CMP/GFR  Obesity with co morbidities Long discussion about weight loss, diet, and exercise Recommended diet heavy in fruits and veggies and low in animal meats, cheeses, and dairy products, appropriate calorie intake Discussed ideal weight for height, end goal of 170lb, initial goal of 210lb set He will work on cutting portions, sweets, splurge meals Will follow up in 3 months  Vitamin D Def At goal at last visit; continue supplementation to maintain goal of 70-100 Defer Vit D level  Hypogonadism - continue replacement therapy, check testosterone levels as needed.   Colostomy (HCC)/history of rectal cancer Doing well without complications.   Continue diet and meds as discussed. Further disposition pending results of labs. Discussed med's effects and SE's.   Over 30 minutes of exam, counseling, chart review, and critical decision making was performed.   Future Appointments  Date Time Provider [place]  01/02/2020  8:45 AM Liane Comber, NP [facility] None  03/27/2020  2:30 PM [name], MD [facility] None  09/12/2020  9:00 AM [name], MD [facility] None     ----------------------------------------------------------------------------------------------------------------------  HPI 59 y.o. male  presents for 3 month follow up on hypertension, cholesterol, prediabetes, hypogonadism on testosterone supplementations, obesity and vitamin D deficiency.  He has hx of rectal cancer with resection and colostomy in 2009 - he is doing well since.   BMI is There is no height or weight on file to calculate BMI., he has been working on diet and exercise, admits he gained a bit over the holidays. He is working on cutting on portions and sweets. Walks daily and is pushing vegetables, cutting down on red meats and dairy.  Wt Readings from Last 3 Encounters:  09/13/19 219 lb 3.2 oz (99.4 kg)  12/22/18 215 lb (97.5 kg)  09/17/18 222 lb 9.6 oz (101 kg)   He has been checking his BP at home, stopped taking medication, today their BP is    He does workout - he walks 30 mins daily.  He denies chest pain, shortness of breath, dizziness.   He is on cholesterol medication - rosuvastatin 20 mg daily - and denies myalgias. His LDL cholesterol is at goal. The cholesterol last visit was:   Lab Results  Component Value Date   CHOL 157 09/13/2019   HDL 47 09/13/2019   LDLCALC 76 09/13/2019   TRIG 245 (H) 09/13/2019   CHOLHDL 3.3 09/13/2019    He has been working on diet and exercise for glucose management, and denies increased appetite, nausea, paresthesia of the feet, polydipsia, polyuria, visual disturbances and vomiting. Last A1C in the office was:  Lab Results  Component Value Date   HGBA1C 5.6 09/13/2019   Patient is on Vitamin D supplement and at goal at the last check:  Lab Results  Component Value Date   VD25OH 76 09/13/2019     He has a history of testosterone deficiency and is on testosterone replacement. *** He states that the testosterone helps with his energy, libido, muscle mass. Lab Results  Component Value Date   TESTOSTERONE 97 (L) 09/13/2019    Patient has dx of gout but not on allopurinol in last few years and does not report a recent flare, he has worked on lifestyle modification (avoiding processed meats and limiting alcohol).  Lab Results  Component Value Date   LABURIC 7.2 09/13/2019    Current Medications:  Current Outpatient Medications on File Prior to Visit  Medication Sig  . aspirin 81 MG tablet Take 81 mg by mouth daily.  . IRON PO Take 500 mg by mouth every other day.  . LOTEMAX 0.5 % GEL   . rosuvastatin (CRESTOR) 40 MG tablet Take 1 tablet Daily for Cholesterol  . sildenafil (VIAGRA) 100 MG tablet Take 1/2 to 1 tablet Daily as needed for XXXX  . tadalafil (CIALIS) 20 MG tablet Take 1/2 to 1 tablet every 2 to 3 days as needed for XXXX  . Testosterone 20.25 MG/ACT (1.62%) GEL Apply 2 pumps Daily to the Skin  . vitamin B-12 (CYANOCOBALAMIN) 500 MCG tablet Take 500 mcg by mouth daily.  Marland Kitchen VITAMIN D PO Take 5,000 Units by mouth 2 (two) times daily.  Marland Kitchen zinc gluconate 50 MG tablet Take 50 mg by mouth daily.   No current facility-administered medications on file prior to visit.     Allergies:  Allergies  Allergen Reactions  . Oxycodone Nausea And Vomiting     Medical History:  Past Medical History:  Diagnosis Date  . Arthritis   . Hyperlipidemia   . Hypertension   . Hypogonadism male   . Pre-diabetes   . 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  HENT: Positive for tinnitus (Ongoing chronic, bilateral, improved with hearing aids). Negative for hearing loss.   Eyes: Negative for blurred vision and double vision.  Respiratory: Negative for cough, shortness of breath and wheezing.   Cardiovascular: Negative for chest pain, palpitations, orthopnea, claudication and leg swelling.  Gastrointestinal: Negative for  abdominal pain, blood in stool, constipation, diarrhea, heartburn, melena, nausea and vomiting.  Genitourinary: Negative.   Musculoskeletal: Negative for joint pain and myalgias.  Skin: Negative for rash.  Neurological: Negative for dizziness, tingling, sensory change, weakness and headaches.  Endo/Heme/Allergies: Negative for polydipsia.  Psychiatric/Behavioral: Negative.   All other systems reviewed and are negative.   Physical Exam: There were no vitals taken for this visit. Wt Readings from Last 3 Encounters:  09/13/19 219 lb 3.2 oz (99.4 kg)  12/22/18 215 lb (97.5 kg)  09/17/18 222 lb 9.6 oz (101 kg)   General Appearance: Well nourished, in no apparent distress. Eyes: PERRLA, EOMs, conjunctiva no swelling or erythema Sinuses: No Frontal/maxillary tenderness ENT/Mouth: Ext aud canals clear, TMs without erythema, bulging. No erythema, swelling, or exudate on post pharynx.  Tonsils not swollen or erythematous. With bilateral hearing aids.  Neck: Supple, thyroid normal.  Respiratory: Respiratory effort normal, BS equal bilaterally without rales, rhonchi, wheezing or stridor.  Cardio: RRR with no MRGs. Brisk peripheral pulses without edema.  Abdomen: Soft, + BS.  Non tender, no guarding, rebound, hernias, masses. Colostomy bag present to left  without ulcers, skin breakdown, purulent discharge/mucus.  Lymphatics: Non tender without lymphadenopathy.  Musculoskeletal: Full ROM, 5/5 strength, Normal gait Skin: Warm, dry without rashes, lesions, ecchymosis.  Neuro: Cranial nerves intact. No cerebellar symptoms.  Psych: Awake and oriented X 3, normal affect, Insight and Judgment appropriate.   Izora Ribas, NP 1:31 PM [facility]

## 2020-01-02 ENCOUNTER — Ambulatory Visit: Payer: BC Managed Care – PPO | Admitting: Adult Health

## 2020-01-19 NOTE — Progress Notes (Signed)
FOLLOW UP  Assessment and Plan:   Hypertension Recently well controlled off of medications  Monitor blood pressure at home; patient to call if consistently greater than 130/80 Continue DASH diet.   Reminder to go to the ER if any CP, SOB, nausea, dizziness, severe HA, changes vision/speech, left arm numbness and tingling and jaw pain. Keep close follow up in March.   Cholesterol Currently at goal; continue rosuvastatin 20 mg daily  Continue low cholesterol diet and exercise.  Check lipid panel.   Other abnormal glucose Last several A1Cs at goal Continue diet and exercise.  Perform daily foot/skin check, notify office of any concerning changes.  Defer A1C; check CMP/GFR  Obesity with co morbidities Long discussion about weight loss, diet, and exercise Recommended diet heavy in fruits and veggies and low in animal meats, cheeses, and dairy products, appropriate calorie intake Discussed ideal weight for height, end goal of 170lb, initial goal of 210lb set He will work on cutting portions, sweets, splurge meals Will follow up in 3 months  Vitamin D Def At goal at last visit; continue supplementation to maintain goal of 70-100 Defer Vit D level  Hypogonadism - continue replacement therapy, zinc supplement, reviewed weight loss, encouraged HIIT and resistance exercises, check testosterone levels as needed.   Colostomy (HCC)/history of rectal cancer Doing well without complications.   Continue diet and meds as discussed. Further disposition pending results of labs. Discussed med's effects and SE's.   Over 30 minutes of exam, counseling, chart review, and critical decision making was performed.   Future Appointments  Date Time Provider [place]  01/20/2020  8:45 AM Liane Comber, NP [facility] None  03/27/2020  2:30 PM [name], MD [facility] None  09/12/2020  9:00 AM [name], MD [facility] None     ----------------------------------------------------------------------------------------------------------------------  HPI 59 y.o. male  presents for 3 month follow up on hypertension, cholesterol, prediabetes, hypogonadism on testosterone supplementations, obesity and vitamin D deficiency.  He has hx of rectal cancer with resection and colostomy in 2009 - he is doing well since.   BMI is Body mass index is 32.69 kg/m., he has been working on diet and exercise. He is working on cutting on portions and breads. Walks daily and is pushing vegetables, cutting down on red meats and dairy.  Gets 40-60+ fluid ounces  Wt Readings from Last 3 Encounters:  01/20/20 215 lb (97.5 kg)  09/13/19 219 lb 3.2 oz (99.4 kg)  12/22/18 215 lb (97.5 kg)   He has been checking his BP at home, doing well off of medications for several years, today their BP is BP: 126/80  He does workout - he walks 30 mins daily.  He denies chest pain, shortness of breath, dizziness.   He is on cholesterol medication - rosuvastatin 20 mg daily - and denies myalgias. His LDL cholesterol is at goal. The cholesterol last visit was:   Lab Results  Component Value Date   CHOL 157 09/13/2019   HDL 47 09/13/2019   LDLCALC 76 09/13/2019   TRIG 245 (H) 09/13/2019   CHOLHDL 3.3 09/13/2019    He has been working on diet and exercise for glucose management, and denies increased appetite, nausea, paresthesia of the feet, polydipsia, polyuria, visual disturbances and vomiting. Last A1C in the office was:  Lab Results  Component Value Date   HGBA1C 5.6 09/13/2019   Patient is on Vitamin D supplement and at goal at the last check:    Lab Results  Component Value Date  VD25OH 76 09/13/2019     He has a history of testosterone deficiency and is on testosterone replacement, using topical testosterone. He states that the testosterone helps with his energy, libido, muscle mass. Lab Results  Component Value Date   TESTOSTERONE 97 (L)  09/13/2019   Patient has dx of gout but not on allopurinol in last few years and does not report a recent flare, he has worked on lifestyle modification (avoiding processed meats and limiting alcohol).  Lab Results  Component Value Date   LABURIC 7.2 09/13/2019     Current Medications:  Current Outpatient Medications on File Prior to Visit  Medication Sig  . aspirin 81 MG tablet Take 81 mg by mouth daily.  . IRON PO Take 500 mg by mouth every other day.  . LOTEMAX 0.5 % GEL   . rosuvastatin (CRESTOR) 40 MG tablet Take 1 tablet Daily for Cholesterol  . sildenafil (VIAGRA) 100 MG tablet Take 1/2 to 1 tablet Daily as needed for XXXX  . tadalafil (CIALIS) 20 MG tablet Take 1/2 to 1 tablet every 2 to 3 days as needed for XXXX  . Testosterone 20.25 MG/ACT (1.62%) GEL Apply 2 pumps Daily to the Skin  . vitamin B-12 (CYANOCOBALAMIN) 500 MCG tablet Take 500 mcg by mouth daily.  Marland Kitchen VITAMIN D PO Take 5,000 Units by mouth 2 (two) times daily.  Marland Kitchen zinc gluconate 50 MG tablet Take 50 mg by mouth daily.   No current facility-administered medications on file prior to visit.     Allergies:  Allergies  Allergen Reactions  . Oxycodone Nausea And Vomiting     Medical History:  Past Medical History:  Diagnosis Date  . Arthritis   . Hyperlipidemia   . Hypertension   . Hypogonadism male   . Pre-diabetes   . 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  HENT: Positive for tinnitus (Ongoing chronic, bilateral, improved with hearing aids). Negative for hearing loss.   Eyes: Negative for blurred vision and double vision.  Respiratory: Negative for cough, shortness of breath and wheezing.   Cardiovascular: Negative for chest pain, palpitations, orthopnea, claudication and leg swelling.  Gastrointestinal:  Negative for abdominal pain, blood in stool, constipation, diarrhea, heartburn, melena, nausea and vomiting.  Genitourinary: Negative.   Musculoskeletal: Negative for joint pain and myalgias.  Skin: Negative for rash.  Neurological: Negative for dizziness, tingling, sensory change, weakness and headaches.  Endo/Heme/Allergies: Negative for polydipsia.  Psychiatric/Behavioral: Negative.   All other systems reviewed and are negative.   Physical Exam: BP 126/80   Pulse 76   Temp 97.9 F (36.6 C)   Ht 5\' 8"  (1.727 m)   Wt 215 lb (97.5 kg)   SpO2 96%   BMI 32.69 kg/m  Wt Readings from Last 3 Encounters:  01/20/20 215 lb (97.5 kg)  09/13/19 219 lb 3.2 oz (99.4 kg)  12/22/18 215 lb (97.5 kg)   General Appearance: Well nourished, in no apparent distress. Eyes: PERRLA, EOMs, conjunctiva no swelling or erythema Sinuses: No Frontal/maxillary tenderness ENT/Mouth: Ext aud canals clear, TMs without erythema, bulging. No erythema, swelling, or exudate on post pharynx.  Tonsils not swollen or erythematous. With bilateral hearing aids.  Neck: Supple, thyroid normal.  Respiratory: Respiratory effort normal, BS equal bilaterally without rales, rhonchi, wheezing or stridor.  Cardio: RRR with no MRGs. Brisk  peripheral pulses without edema.  Abdomen: Soft, + BS.  Non tender, no guarding, rebound, masses. Colostomy bag present to left without ulcers, skin breakdown, purulent discharge/mucus.  Lymphatics: Non tender without lymphadenopathy.  Musculoskeletal: Full ROM, 5/5 strength, Normal gait Skin: Warm, dry without rashes, lesions, ecchymosis.  Neuro: Cranial nerves intact. No cerebellar symptoms.  Psych: Awake and oriented X 3, normal affect, Insight and Judgment appropriate.   Izora Ribas, NP 8:44 AM [facility]

## 2020-01-20 ENCOUNTER — Other Ambulatory Visit: Payer: Self-pay

## 2020-01-20 ENCOUNTER — Ambulatory Visit (INDEPENDENT_AMBULATORY_CARE_PROVIDER_SITE_OTHER): Payer: BC Managed Care – PPO | Admitting: Adult Health

## 2020-01-20 ENCOUNTER — Encounter: Payer: Self-pay | Admitting: Adult Health

## 2020-01-20 VITALS — BP 126/80 | HR 76 | Temp 97.9°F | Ht 68.0 in | Wt 215.0 lb

## 2020-01-20 DIAGNOSIS — E559 Vitamin D deficiency, unspecified: Secondary | ICD-10-CM

## 2020-01-20 DIAGNOSIS — E785 Hyperlipidemia, unspecified: Secondary | ICD-10-CM | POA: Diagnosis not present

## 2020-01-20 DIAGNOSIS — E349 Endocrine disorder, unspecified: Secondary | ICD-10-CM

## 2020-01-20 DIAGNOSIS — I10 Essential (primary) hypertension: Secondary | ICD-10-CM

## 2020-01-20 DIAGNOSIS — R7309 Other abnormal glucose: Secondary | ICD-10-CM | POA: Diagnosis not present

## 2020-01-20 DIAGNOSIS — M1000 Idiopathic gout, unspecified site: Secondary | ICD-10-CM

## 2020-01-20 DIAGNOSIS — Z79899 Other long term (current) drug therapy: Secondary | ICD-10-CM

## 2020-01-20 DIAGNOSIS — Z933 Colostomy status: Secondary | ICD-10-CM

## 2020-01-20 DIAGNOSIS — Z85048 Personal history of other malignant neoplasm of rectum, rectosigmoid junction, and anus: Secondary | ICD-10-CM

## 2020-01-20 DIAGNOSIS — E669 Obesity, unspecified: Secondary | ICD-10-CM

## 2020-01-20 NOTE — Patient Instructions (Addendum)
Goals    . Weight (lb) < 210 lb (95.3 kg) (pt-stated)     End goal 170lb Cut portions, sweets Increase fruits/vegetables Limit splurge meals - I.e. burgers Meal plan for lunch and take with          Can do zinc 40-50 mg a day Can try shake that is whey protein, almond milk, avocado oil, and spinach and strawberries in the morning  9 Ways to Naturally Increase Testosterone Levels  1.   Lose Weight If you're overweight, shedding the excess pounds may increase your testosterone levels, according to research presented at the Endocrine Society's 2012 meeting. Overweight men are more likely to have low testosterone levels to begin with, so this is an important trick to increase your body's testosterone production when you need it most.  2.   High-Intensity Exercise like Peak Fitness  Short intense exercise has a proven positive effect on increasing testosterone levels and preventing its decline. That's unlike aerobics or prolonged moderate exercise, which have shown to have negative or no effect on testosterone levels. Having a whey protein meal after exercise can further enhance the satiety/testosterone-boosting impact (hunger hormones cause the opposite effect on your testosterone and libido). Here's a summary of what a typical high-intensity Peak Fitness routine might look like: " Warm up for three minutes  " Exercise as hard and fast as you can for 30 seconds. You should feel like you couldn't possibly go on another few seconds  " Recover at a slow to moderate pace for 90 seconds  " Repeat the high intensity exercise and recovery 7 more times .  3.   Consume Plenty of Zinc The mineral zinc is important for testosterone production, and supplementing your diet for as little as six weeks has been shown to cause a marked improvement in testosterone among men with low levels.1 Likewise, research has shown that restricting dietary sources of zinc leads to a significant decrease in  testosterone, while zinc supplementation increases it2 -- and even protects men from exercised-induced reductions in testosterone levels.3 It's estimated that up to 64 percent of adults over the age of 60 may have lower than recommended zinc intakes; even when dietary supplements were added in, an estimated 20-25 percent of older adults still had inadequate zinc intakes, according to a [employer] and Nutrition Examination Survey.4 Your diet is the best source of zinc; along with protein-rich foods like meats and fish, other good dietary sources of zinc include raw milk, raw cheese, beans, and yogurt or kefir made from raw milk. It can be difficult to obtain enough dietary zinc if you're a vegetarian, and also for meat-eaters as well, largely because of conventional farming methods that rely heavily on chemical fertilizers and pesticides. These chemicals deplete the soil of nutrients ... nutrients like zinc that must be absorbed by plants in order to be passed on to you. In many cases, you may further deplete the nutrients in your food by the way you prepare it. For most food, cooking it will drastically reduce its levels of nutrients like zinc ... particularly over-cooking, which many people do. If you decide to use a zinc supplement, stick to a dosage of less than 40 mg a day, as this is the recommended adult upper limit. Taking too much zinc can interfere with your body's ability to absorb other minerals, especially copper, and may cause nausea as a side effect.  4.   Strength Training In addition to Peak Fitness, strength training is also  known to boost testosterone levels, provided you are doing so intensely enough. When strength training to boost testosterone, you'll want to increase the weight and lower your number of reps, and then focus on exercises that work a large number of muscles, such as dead lifts or squats.  You can "turbo-charge" your weight training by going slower. By slowing down  your movement, you're actually turning it into a high-intensity exercise. Super Slow movement allows your muscle, at the microscopic level, to access the maximum number of cross-bridges between the protein filaments that produce movement in the muscle.   5.   Optimize Your Vitamin D Levels Vitamin D, a steroid hormone, is essential for the healthy development of the nucleus of the sperm cell, and helps maintain semen quality and sperm count. Vitamin D also increases levels of testosterone, which may boost libido. In one study, overweight men who were given vitamin D supplements had a significant increase in testosterone levels after one year.5   6.   Reduce Stress When you're under a lot of stress, your body releases high levels of the stress hormone cortisol. This hormone actually blocks the effects of testosterone,6 presumably because, from a biological standpoint, testosterone-associated behaviors (mating, competing, aggression) may have lowered your chances of survival in an emergency (hence, the "fight or flight" response is dominant, courtesy of cortisol).  7.   Limit or Eliminate Sugar from Your Diet Testosterone levels decrease after you eat sugar, which is likely because the sugar leads to a high insulin level, another factor leading to low testosterone.7 Based on USDA estimates, the average American consumes 12 teaspoons of sugar a day, which equates to about TWO TONS of sugar during a lifetime.  8.   Eat Healthy Fats By healthy, this means not only mon- and polyunsaturated fats, like that found in avocadoes and nuts, but also saturated, as these are essential for building testosterone. Research shows that a diet with less than 40 percent of energy as fat (and that mainly from animal sources, i.e. saturated) lead to a decrease in testosterone levels.8 My personal diet is about 60-70 percent healthy fat, and other experts agree that the ideal diet includes somewhere between 50-70 percent fat.   It's important to understand that your body requires saturated fats from animal and vegetable sources (such as meat, dairy, certain oils, and tropical plants like coconut) for optimal functioning, and if you neglect this important food group in favor of sugar, grains and other starchy carbs, your health and weight are almost guaranteed to suffer. Examples of healthy fats you can eat more of to give your testosterone levels a boost include: Olives and Olive oil  Coconuts and coconut oil Butter made from raw grass-fed organic milk Raw nuts, such as, almonds or pecans Organic pastured egg yolks Avocados Grass-fed meats Palm oil Unheated organic nut oils   9.   Boost Your Intake of Branch Chain Amino Acids (BCAA) from Foods Like [place] suggests that BCAAs result in higher testosterone levels, particularly when taken along with resistance training.9 While BCAAs are available in supplement form, you'll find the highest concentrations of BCAAs like leucine in dairy products - especially quality cheeses and whey protein. Even when getting leucine from your natural food supply, it's often wasted or used as a building block instead of an anabolic agent. So to create the correct anabolic environment, you need to boost leucine consumption way beyond mere maintenance levels. That said, keep in mind that using leucine as a  free form amino acid can be highly counterproductive as when free form amino acids are artificially administrated, they rapidly enter your circulation while disrupting insulin function, and impairing your body's glycemic control. Food-based leucine is really the ideal form that can benefit your muscles without side effects.

## 2020-01-21 LAB — LIPID PANEL
Cholesterol: 131 mg/dL (ref <200)
HDL: 49 mg/dL (ref >=40)
LDL Cholesterol (Calc): 66 mg/dL (calc)
Non-HDL Cholesterol (Calc): 82 mg/dL (calc) (ref <130)
Total CHOL/HDL Ratio: 2.7 (calc) (ref <5.0)
Triglycerides: 81 mg/dL (ref <150)

## 2020-01-21 LAB — CBC WITH DIFFERENTIAL/PLATELET
Absolute Monocytes: 432 cells/uL (ref 200–950)
Basophils Absolute: 21 cells/uL (ref 0–200)
Basophils Relative: 0.4 %
Eosinophils Absolute: 83 cells/uL (ref 15–500)
Eosinophils Relative: 1.6 %
HCT: 45.2 % (ref 38.5–50.0)
Hemoglobin: 14.9 g/dL (ref 13.2–17.1)
Lymphs Abs: 1352 cells/uL (ref 850–3900)
MCH: 27.5 pg (ref 27.0–33.0)
MCHC: 33 g/dL (ref 32.0–36.0)
MCV: 83.5 fL (ref 80.0–100.0)
MPV: 11.9 fL (ref 7.5–12.5)
Monocytes Relative: 8.3 %
Neutro Abs: 3312 cells/uL (ref 1500–7800)
Neutrophils Relative %: 63.7 %
Platelets: 186 10*3/uL (ref 140–400)
RBC: 5.41 10*6/uL (ref 4.20–5.80)
RDW: 13.7 % (ref 11.0–15.0)
Total Lymphocyte: 26 %
WBC: 5.2 10*3/uL (ref 3.8–10.8)

## 2020-01-21 LAB — COMPLETE METABOLIC PANEL WITH GFR
AG Ratio: 2.3 (calc) (ref 1.0–2.5)
ALT: 22 U/L (ref 9–46)
AST: 14 U/L (ref 10–35)
Albumin: 4.4 g/dL (ref 3.6–5.1)
Alkaline phosphatase (APISO): 48 U/L (ref 35–144)
BUN: 16 mg/dL (ref 7–25)
CO2: 27 mmol/L (ref 20–32)
Calcium: 9.7 mg/dL (ref 8.6–10.3)
Chloride: 104 mmol/L (ref 98–110)
Creat: 0.9 mg/dL (ref 0.70–1.25)
GFR, Est African American: 107 mL/min/{1.73_m2} (ref >=60)
GFR, Est Non African American: 93 mL/min/{1.73_m2} (ref >=60)
Globulin: 1.9 g/dL (calc) (ref 1.9–3.7)
Glucose, Bld: 98 mg/dL (ref 65–99)
Potassium: 4.6 mmol/L (ref 3.5–5.3)
Sodium: 139 mmol/L (ref 135–146)
Total Bilirubin: 0.7 mg/dL (ref 0.2–1.2)
Total Protein: 6.3 g/dL (ref 6.1–8.1)

## 2020-01-21 LAB — TSH: TSH: 1.65 mIU/L (ref 0.40–4.50)

## 2020-01-24 ENCOUNTER — Telehealth: Payer: Self-pay | Admitting: *Deleted

## 2020-01-24 ENCOUNTER — Other Ambulatory Visit: Payer: Self-pay | Admitting: Internal Medicine

## 2020-01-24 MED ORDER — MELOXICAM 15 MG PO TABS
ORAL_TABLET | ORAL | 0 refills | Status: DC
Start: 2020-01-24 — End: 2021-09-06

## 2020-01-24 NOTE — Telephone Encounter (Signed)
Patient called and reported he thinks he is having gout pain again. Per Dr Melford Aase, his last 3 uric acid tests have been normal. Dr Melford Aase sent in an RX for Meloxicam 15 mg tablet and the patient was advised to also take Tylenol 500 mg 2 tablets 4 times a day, at meals and bedtime, for pain. The patient would need an office visit and labs to confirm a diagnosis of gout, to be treated specifically for gout. Patient is aware and try Meloxicam before returning for labs.

## 2020-03-27 ENCOUNTER — Ambulatory Visit: Payer: BC Managed Care – PPO | Admitting: Internal Medicine

## 2020-04-21 ENCOUNTER — Other Ambulatory Visit: Payer: Self-pay | Admitting: Internal Medicine

## 2020-04-21 DIAGNOSIS — E349 Endocrine disorder, unspecified: Secondary | ICD-10-CM

## 2020-05-02 ENCOUNTER — Ambulatory Visit: Payer: BC Managed Care – PPO | Admitting: Internal Medicine

## 2020-05-15 ENCOUNTER — Encounter: Payer: Self-pay | Admitting: Internal Medicine

## 2020-05-15 ENCOUNTER — Other Ambulatory Visit: Payer: Self-pay

## 2020-05-15 ENCOUNTER — Ambulatory Visit (INDEPENDENT_AMBULATORY_CARE_PROVIDER_SITE_OTHER): Payer: BC Managed Care – PPO | Admitting: Internal Medicine

## 2020-05-15 VITALS — BP 122/76 | HR 76 | Temp 96.7°F | Resp 16 | Ht 68.0 in | Wt 213.0 lb

## 2020-05-15 DIAGNOSIS — R7309 Other abnormal glucose: Secondary | ICD-10-CM

## 2020-05-15 DIAGNOSIS — R0989 Other specified symptoms and signs involving the circulatory and respiratory systems: Secondary | ICD-10-CM | POA: Diagnosis not present

## 2020-05-15 DIAGNOSIS — Z79899 Other long term (current) drug therapy: Secondary | ICD-10-CM | POA: Diagnosis not present

## 2020-05-15 DIAGNOSIS — E349 Endocrine disorder, unspecified: Secondary | ICD-10-CM

## 2020-05-15 DIAGNOSIS — E559 Vitamin D deficiency, unspecified: Secondary | ICD-10-CM | POA: Diagnosis not present

## 2020-05-15 DIAGNOSIS — M1000 Idiopathic gout, unspecified site: Secondary | ICD-10-CM

## 2020-05-15 DIAGNOSIS — E782 Mixed hyperlipidemia: Secondary | ICD-10-CM

## 2020-05-15 NOTE — Progress Notes (Signed)
History of Present Illness:       This very nice 59 y.o.  MWM presents for 6  month follow up with HTN, HLD, Pre-Diabetes and Vitamin D Deficiency.       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 His original  surgery was at [facility]  Last colonoscopy 12/2016 w/Dr Ardis Hughs & next scheduled for 5 years -May 2023.         Patient is followed expectantly for labile  HTN & BP has been controlled and today's BP is at goal - 122/76. Patient has had no complaints of any cardiac type chest pain, palpitations, dyspnea / orthopnea / PND, dizziness, claudication, or dependent edema.  BP Readings from Last 3 Encounters:  01/20/20 126/80  09/13/19 116/70  12/22/18 136/88        Hyperlipidemia is controlled with diet & Rosuvastatin. Patient denies myalgias or other med SE's. Last Lipids were at goal:  Lab Results  Component Value Date   CHOL 131 01/20/2020   HDL 49 01/20/2020   LDLCALC 66 01/20/2020   TRIG 81 01/20/2020   CHOLHDL 2.7 01/20/2020    Also, the patient has Moderate Obesity (BMI 33+) and consequent PreDiabetes (A1c 5.7% /2012)  and has had no symptoms of reactive hypoglycemia, diabetic polys, paresthesias or visual blurring.  Last A1c was Normal & at goal:  Lab Results  Component Value Date   HGBA1C 5.6 09/13/2019        He has  Testosterone Deficiency ("260" /2012)  and is on testosterone gel  replacement. He improved sense of well being, stamina and energy on replacement.         Further, the patient also has history of Vitamin D Deficiency ("260" /2012) and supplements vitamin D without any suspected side-effects. Last vitamin D was at goal:  Lab Results  Component Value Date   VD25OH 76 09/13/2019      Patient has remote hx/o Gout and off therapy he has had no gout flare-ups.   Current Outpatient Medications on File Prior to Visit  Medication Sig  . aspirin 81 MG tablet Take 81 mg by mouth daily.  . IRON PO Take  500 mg by mouth every other day.  . LOTEMAX 0.5 % GEL   . meloxicam (MOBIC) 15 MG tablet Take 1/2 to 1 tablet Daily with Food for Pain & Inflammation  . rosuvastatin (CRESTOR) 40 MG tablet Take 1 tablet Daily for Cholesterol  . tadalafil (CIALIS) 20 MG tablet Take 1/2 to 1 tablet every 2 to 3 days as needed for XXXX  . Testosterone 20.25 MG/ACT (1.62%) GEL Apply 2 pumps    Daily     to Skin  . vitamin B-12 (CYANOCOBALAMIN) 500 MCG tablet Take 500 mcg by mouth daily.  Marland Kitchen VITAMIN D PO Take 5,000 Units by mouth 2 (two) times daily.  Marland Kitchen zinc gluconate 50 MG tablet Take 50 mg by mouth daily.   No current facility-administered medications on file prior to visit.    Allergies  Allergen Reactions  . Oxycodone Nausea And Vomiting    PMHx:   Past Medical History:  Diagnosis Date  . Arthritis   . Hyperlipidemia   . Hypertension   . Hypogonadism male   . Pre-diabetes   . 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 PPD Test 09/09/2013, 09/11/2014, 09/20/2015, 04/16/2017, 05/18/2018  . Tdap  02/17/2008    Past Surgical History:  Procedure Laterality Date  . ABDOMINOPERINEAL PROCTOCOLECTOMY N/A 07/18/2008   Dr Morton Stall, [facility].  Very distal rectal cancer  . colonoscopsy  09/2011  . PARASTOMAL HERNIA REPAIR  2010   Dr Lenise Arena,  [facility]  . RECONSTRUCT / STABILIZE DISTAL ULNA    . WISDOM TOOTH EXTRACTION      FHx:    Reviewed / unchanged  SHx:    Reviewed / unchanged   Systems Review:  Constitutional: Denies fever, chills, wt changes, headaches, insomnia, fatigue, night sweats, change in appetite. Eyes: Denies redness, blurred vision, diplopia, discharge, itchy, watery eyes.  ENT: Denies discharge, congestion, post nasal drip, epistaxis, sore throat, earache, hearing loss, dental pain, tinnitus, vertigo, sinus pain, snoring.  CV: Denies chest pain, palpitations, irregular heartbeat, syncope, dyspnea, diaphoresis, orthopnea,  PND, claudication or edema. Respiratory: denies cough, dyspnea, DOE, pleurisy, hoarseness, laryngitis, wheezing.  Gastrointestinal: Denies dysphagia, odynophagia, heartburn, reflux, water brash, abdominal pain or cramps, nausea, vomiting, bloating, diarrhea, constipation, hematemesis, melena, hematochezia  or hemorrhoids. Genitourinary: Denies dysuria, frequency, urgency, nocturia, hesitancy, discharge, hematuria or flank pain. Musculoskeletal: Denies arthralgias, myalgias, stiffness, jt. swelling, pain, limping or strain/sprain.  Skin: Denies pruritus, rash, hives, warts, acne, eczema or change in skin lesion(s). Neuro: No weakness, tremor, incoordination, spasms, paresthesia or pain. Psychiatric: Denies confusion, memory loss or sensory loss. Endo: Denies change in weight, skin or hair change.  Heme/Lymph: No excessive bleeding, bruising or enlarged lymph nodes.  Physical Exam  BP 122/76   Pulse 76   Temp (!) 96.7 F (35.9 C)   Resp 16   Ht 5\' 8"  (1.727 m)   Wt 213 lb (96.6 kg)   BMI 32.39 kg/m   Appears  well nourished, well groomed  and in no distress.  Eyes: PERRLA, EOMs, conjunctiva no swelling or erythema. Sinuses: No frontal/maxillary tenderness ENT/Mouth: EAC's clear, TM's nl w/o erythema, bulging. Nares clear w/o erythema, swelling, exudates. Oropharynx clear without erythema or exudates. Oral hygiene is good. Tongue normal, non obstructing. Hearing intact.  Neck: Supple. Thyroid not palpable. Car 2+/2+ without bruits, nodes or JVD. Chest: Respirations nl with BS clear & equal w/o rales, rhonchi, wheezing or stridor.  Cor: Heart sounds normal w/ regular rate and rhythm without sig. murmurs, gallops, clicks or rubs. Peripheral pulses normal and equal  without edema.  Abdomen: Soft & bowel sounds normal.  LLQ colostomy with mild lower midline diastasis  & laxity of the abdominal musculature.  Non-tender w/o guarding, rebound, masses or organomegaly.  Lymphatics: Unremarkable.    Musculoskeletal: Full ROM all peripheral extremities, joint stability, 5/5 strength and normal gait.  Skin: Warm, dry without exposed rashes, lesions or ecchymosis apparent.  Neuro: Cranial nerves intact, reflexes equal bilaterally. Sensory-motor testing grossly intact. Tendon reflexes grossly intact.  Pysch: Alert & oriented x 3.  Insight and judgement nl & appropriate. No ideations.  Assessment and Plan:  1. Labile hypertension  - Continue medication, monitor blood pressure at home.  - Continue DASH diet.  Reminder to go to the ER if any CP,  SOB, nausea, dizziness, severe HA, changes vision/speech.  - CBC with Differential/Platelet - COMPLETE METABOLIC PANEL WITH GFR - Magnesium - TSH  2. Hyperlipidemia, mixed  - Continue diet/meds, exercise,& lifestyle modifications.  - Continue monitor periodic cholesterol/liver & renal functions   - Lipid panel - TSH  3. Abnormal glucose  - Continue diet, exercise  - Lifestyle modifications.  - Monitor appropriate labs.  - Hemoglobin A1c - Insulin,  random  4. Vitamin D deficiency  - Continue supplementation.  - VITAMIN D 25 Hydroxy  5. Testosterone deficiency  - Testosterone  6. Idiopathic gout  - Uric acid  7. Medication management  - CBC with Differential/Platelet - COMPLETE METABOLIC PANEL WITH GFR - Magnesium - Lipid panel - TSH - Hemoglobin A1c - Insulin, random - VITAMIN D 25 Hydroxy - Uric acid - Testosterone       Discussed  regular exercise, BP monitoring, weight control to achieve/maintain BMI less than 25 and discussed med and SE's. Recommended labs to assess and monitor clinical status with further disposition pending results of labs.  I discussed the assessment and treatment plan with the patient. The patient was provided an opportunity to ask questions and all were answered. The patient agreed with the plan and demonstrated an understanding of the instructions.  I provided over 30 minutes of exam,  counseling, chart review and  complex critical decision making.    Kirtland Bouchard, MD

## 2020-05-15 NOTE — Patient Instructions (Signed)

## 2020-05-16 LAB — COMPLETE METABOLIC PANEL WITH GFR
AG Ratio: 2.1 (calc) (ref 1.0–2.5)
ALT: 18 U/L (ref 9–46)
AST: 13 U/L (ref 10–35)
Albumin: 4.5 g/dL (ref 3.6–5.1)
Alkaline phosphatase (APISO): 53 U/L (ref 35–144)
BUN: 22 mg/dL (ref 7–25)
CO2: 27 mmol/L (ref 20–32)
Calcium: 9.4 mg/dL (ref 8.6–10.3)
Chloride: 104 mmol/L (ref 98–110)
Creat: 1.1 mg/dL (ref 0.70–1.25)
GFR, Est African American: 84 mL/min/{1.73_m2} (ref >=60)
GFR, Est Non African American: 73 mL/min/{1.73_m2} (ref >=60)
Globulin: 2.1 g/dL (calc) (ref 1.9–3.7)
Glucose, Bld: 112 mg/dL — ABNORMAL HIGH (ref 65–99)
Potassium: 4.4 mmol/L (ref 3.5–5.3)
Sodium: 141 mmol/L (ref 135–146)
Total Bilirubin: 0.9 mg/dL (ref 0.2–1.2)
Total Protein: 6.6 g/dL (ref 6.1–8.1)

## 2020-05-16 LAB — CBC WITH DIFFERENTIAL/PLATELET
Absolute Monocytes: 570 cells/uL (ref 200–950)
Basophils Absolute: 30 cells/uL (ref 0–200)
Basophils Relative: 0.4 %
Eosinophils Absolute: 118 cells/uL (ref 15–500)
Eosinophils Relative: 1.6 %
HCT: 44.7 % (ref 38.5–50.0)
Hemoglobin: 14.7 g/dL (ref 13.2–17.1)
Lymphs Abs: 2139 cells/uL (ref 850–3900)
MCH: 27.7 pg (ref 27.0–33.0)
MCHC: 32.9 g/dL (ref 32.0–36.0)
MCV: 84.3 fL (ref 80.0–100.0)
MPV: 11.8 fL (ref 7.5–12.5)
Monocytes Relative: 7.7 %
Neutro Abs: 4544 cells/uL (ref 1500–7800)
Neutrophils Relative %: 61.4 %
Platelets: 188 10*3/uL (ref 140–400)
RBC: 5.3 10*6/uL (ref 4.20–5.80)
RDW: 13.6 % (ref 11.0–15.0)
Total Lymphocyte: 28.9 %
WBC: 7.4 10*3/uL (ref 3.8–10.8)

## 2020-05-16 LAB — LIPID PANEL
Cholesterol: 138 mg/dL (ref <200)
HDL: 49 mg/dL (ref >=40)
LDL Cholesterol (Calc): 63 mg/dL (calc)
Non-HDL Cholesterol (Calc): 89 mg/dL (calc) (ref <130)
Total CHOL/HDL Ratio: 2.8 (calc) (ref <5.0)
Triglycerides: 188 mg/dL — ABNORMAL HIGH (ref <150)

## 2020-05-16 LAB — HEMOGLOBIN A1C
Hgb A1c MFr Bld: 5.6 % of total Hgb (ref <5.7)
Mean Plasma Glucose: 114 (calc)
eAG (mmol/L): 6.3 (calc)

## 2020-05-16 LAB — INSULIN, RANDOM: Insulin: 60.6 u[IU]/mL — ABNORMAL HIGH

## 2020-05-16 LAB — URIC ACID: Uric Acid, Serum: 8 mg/dL (ref 4.0–8.0)

## 2020-05-16 LAB — MAGNESIUM: Magnesium: 2.3 mg/dL (ref 1.5–2.5)

## 2020-05-16 LAB — VITAMIN D 25 HYDROXY (VIT D DEFICIENCY, FRACTURES): Vit D, 25-Hydroxy: 60 ng/mL (ref 30–100)

## 2020-05-16 LAB — TSH: TSH: 1.76 mIU/L (ref 0.40–4.50)

## 2020-05-16 LAB — TESTOSTERONE: Testosterone: 86 ng/dL — ABNORMAL LOW (ref 250–827)

## 2020-05-16 NOTE — Progress Notes (Signed)
========================================================== -   Test results slightly outside the reference range are not unusual. If there is anything important, I will review this with you,  otherwise it is considered normal test values.  If you have further questions,  please do not hesitate to contact me at the office or via My Chart.  ==========================================================  -  Total Chol = 138 and LDL Chol = 63 - Both  Excellent   - Very low risk for Heart Attack  / Stroke ==========================================================  - Triglycerides (  188   ) or fats in blood are too high  (goal is less than 150)    - Recommend avoid fried & greasy foods,  sweets / candy,   - Avoid white rice  (brown or wild rice or Quinoa is OK),   - Avoid white potatoes  (sweet potatoes are OK)   - Avoid anything made from white flour  - bagels, doughnuts, rolls, buns, biscuits, white and   wheat breads, pizza crust and traditional  pasta made of white flour & egg white  - (vegetarian pasta or spinach or wheat pasta is OK).    - Multi-grain bread is OK - like multi-grain flat bread or  sandwich thins.   - Avoid alcohol in excess.   - Exercise is also important. ==========================================================  - A1c - Normal - Great - No Diabetes  ! ==========================================================  - Insulin level = 60.6  is elevated  (Normal is less  than 20) & this shows  "Insulin Resistance"   Blood sugar and A1c are elevated in the borderline and  early or pre-diabetes range which has the same   300% increased risk for heart attack, stroke, cancer and   alzheimer- type vascular dementia as full blown diabetes.   But the good news is that diet, exercise with  weight loss can cure the early diabetes at this point. ==========================================================  - Vitamin D = 60 - Excellent   ==========================================================  - Testosterone still Low - So please continue the Testosterone gel Daily ==========================================================  - All Else - CBC - Kidneys - Electrolytes - Liver - Magnesium & Thyroid    - all  Normal / OK ==========================================================

## 2020-07-31 ENCOUNTER — Encounter: Payer: 59 | Admitting: Internal Medicine

## 2020-08-20 NOTE — Progress Notes (Deleted)
FOLLOW UP  Assessment and Plan:   Hypertension Recently well controlled off of medications  Monitor blood pressure at home; patient to call if consistently greater than 130/80 Continue DASH diet.   Reminder to go to the ER if any CP, SOB, nausea, dizziness, severe HA, changes vision/speech, left arm numbness and tingling and jaw pain. Keep close follow up in March.   Cholesterol Currently at goal; continue rosuvastatin 20 mg daily  Continue low cholesterol diet and exercise.  Check lipid panel.   Other abnormal glucose Last several A1Cs at goal Continue diet and exercise.  Perform daily foot/skin check, notify office of any concerning changes.  Defer A1C; check CMP/GFR  Obesity with co morbidities Long discussion about weight loss, diet, and exercise Recommended diet heavy in fruits and veggies and low in animal meats, cheeses, and dairy products, appropriate calorie intake Discussed ideal weight for height, end goal of 170lb, initial goal of 210lb set He will work on cutting portions, sweets, splurge meals Will follow up in 3 months  Vitamin D Def At goal at last visit; continue supplementation to maintain goal of 70-100 Defer Vit D level  Hypogonadism - continue replacement therapy, zinc supplement, reviewed weight loss, encouraged HIIT and resistance exercises, check testosterone levels as needed.   Colostomy (HCC)/history of rectal cancer Doing well without complications.   Continue diet and meds as discussed. Further disposition pending results of labs. Discussed med's effects and SE's.   Over 30 minutes of exam, counseling, chart review, and critical decision making was performed.   Future Appointments  Date Time Provider Bruno  08/21/2020  8:45 AM Liane Comber, NP [facility] None  01/04/2021 11:00 AM [name], MD [facility] None     ----------------------------------------------------------------------------------------------------------------------  HPI 59 y.o. male  presents for 3 month follow up on hypertension, cholesterol, prediabetes, hypogonadism on testosterone supplementations, obesity and vitamin D deficiency.  He has hx of rectal cancer with resection and colostomy in 2009 - he is doing well since.   BMI is There is no height or weight on file to calculate BMI., he has been working on diet and exercise. He is working on cutting on portions and breads. Walks daily and is pushing vegetables, cutting down on red meats and dairy.  Gets 40-60+ fluid ounces  Wt Readings from Last 3 Encounters:  05/15/20 213 lb (96.6 kg)  01/20/20 215 lb (97.5 kg)  09/13/19 219 lb 3.2 oz (99.4 kg)   He has been checking his BP at home, doing well off of medications for several years, today their BP is    He does workout - he walks 30 mins daily.  He denies chest pain, shortness of breath, dizziness.   He is on cholesterol medication - rosuvastatin 20 mg daily - and denies myalgias. His LDL cholesterol is at goal. The cholesterol last visit was:   Lab Results  Component Value Date   CHOL 138 05/15/2020   HDL 49 05/15/2020   LDLCALC 63 05/15/2020   TRIG 188 (H) 05/15/2020   CHOLHDL 2.8 05/15/2020    He has been working on diet and exercise for glucose management, and denies increased appetite, nausea, paresthesia of the feet, polydipsia, polyuria, visual disturbances and vomiting. Last A1C in the office was:  Lab Results  Component Value Date   HGBA1C 5.6 05/15/2020   Patient is on Vitamin D supplement and at goal at the last check:    Lab Results  Component Value Date   VD25OH 60 05/15/2020  He has a history of testosterone deficiency and is on testosterone replacement, using topical testosterone. He states that the testosterone helps with his energy, libido, muscle mass. Lab Results  Component Value Date    TESTOSTERONE 86 (L) 05/15/2020   Patient has dx of gout but not on allopurinol in last few years and does not report a recent flare, he has worked on lifestyle modification (avoiding processed meats and limiting alcohol).  Lab Results  Component Value Date   LABURIC 8.0 05/15/2020     Current Medications:  Current Outpatient Medications on File Prior to Visit  Medication Sig  . aspirin 81 MG tablet Take 81 mg by mouth daily.  . IRON PO Take 500 mg by mouth every other day.  . LOTEMAX 0.5 % GEL   . meloxicam (MOBIC) 15 MG tablet Take 1/2 to 1 tablet Daily with Food for Pain & Inflammation  . rosuvastatin (CRESTOR) 40 MG tablet Take 1 tablet Daily for Cholesterol  . tadalafil (CIALIS) 20 MG tablet Take 1/2 to 1 tablet every 2 to 3 days as needed for XXXX  . Testosterone 20.25 MG/ACT (1.62%) GEL Apply 2 pumps    Daily     to Skin  . vitamin B-12 (CYANOCOBALAMIN) 500 MCG tablet Take 500 mcg by mouth daily.  Marland Kitchen VITAMIN D PO Take 5,000 Units by mouth 2 (two) times daily.  Marland Kitchen zinc gluconate 50 MG tablet Take 50 mg by mouth daily.   No current facility-administered medications on file prior to visit.     Allergies:  Allergies  Allergen Reactions  . Oxycodone Nausea And Vomiting     Medical History:  Past Medical History:  Diagnosis Date  . Arthritis   . Hyperlipidemia   . Hypertension   . Hypogonadism male   . Pre-diabetes   . 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  HENT: Positive for tinnitus (Ongoing chronic, bilateral, improved with hearing aids). Negative for hearing loss.   Eyes: Negative for blurred vision and double vision.  Respiratory: Negative for cough, shortness of breath and wheezing.   Cardiovascular: Negative for chest pain, palpitations, orthopnea, claudication and leg  swelling.  Gastrointestinal: Negative for abdominal pain, blood in stool, constipation, diarrhea, heartburn, melena, nausea and vomiting.  Genitourinary: Negative.   Musculoskeletal: Negative for joint pain and myalgias.  Skin: Negative for rash.  Neurological: Negative for dizziness, tingling, sensory change, weakness and headaches.  Endo/Heme/Allergies: Negative for polydipsia.  Psychiatric/Behavioral: Negative.   All other systems reviewed and are negative.   Physical Exam: There were no vitals taken for this visit. Wt Readings from Last 3 Encounters:  05/15/20 213 lb (96.6 kg)  01/20/20 215 lb (97.5 kg)  09/13/19 219 lb 3.2 oz (99.4 kg)   General Appearance: Well nourished, in no apparent distress. Eyes: PERRLA, EOMs, conjunctiva no swelling or erythema Sinuses: No Frontal/maxillary tenderness ENT/Mouth: Ext aud canals clear, TMs without erythema, bulging. No erythema, swelling, or exudate on post pharynx.  Tonsils not swollen or erythematous. With bilateral hearing aids.  Neck: Supple, thyroid normal.  Respiratory: Respiratory effort normal, BS equal bilaterally without rales, rhonchi, wheezing or stridor.  Cardio: RRR with no MRGs. Brisk peripheral pulses without edema.  Abdomen: Soft, + BS.  Non tender, no guarding, rebound, masses. Colostomy bag present to left without ulcers, skin breakdown, purulent discharge/mucus.  Lymphatics: Non tender without lymphadenopathy.  Musculoskeletal: Full ROM, 5/5 strength, Normal gait Skin: Warm, dry without rashes, lesions, ecchymosis.  Neuro: Cranial nerves intact. No cerebellar symptoms.  Psych: Awake and oriented X 3, normal affect, Insight and Judgment appropriate.   Izora Ribas, NP 8:10 AM [facility]

## 2020-08-21 ENCOUNTER — Ambulatory Visit: Payer: BC Managed Care – PPO | Admitting: Adult Health

## 2020-09-12 ENCOUNTER — Encounter: Payer: 59 | Admitting: Internal Medicine

## 2020-09-14 ENCOUNTER — Other Ambulatory Visit: Payer: 59

## 2020-09-14 DIAGNOSIS — Z20822 Contact with and (suspected) exposure to covid-19: Secondary | ICD-10-CM

## 2020-09-18 LAB — NOVEL CORONAVIRUS, NAA: SARS-CoV-2, NAA: DETECTED — AB

## 2020-09-19 ENCOUNTER — Telehealth: Payer: Self-pay

## 2020-09-19 NOTE — Telephone Encounter (Signed)
Called to discuss with patient about COVID-19 symptoms and the use of one of the available treatments for those with mild to moderate Covid symptoms and at a high risk of hospitalization.  Pt appears to qualify for outpatient treatment due to co-morbid conditions and/or a member of an at-risk group in accordance with the FDA Emergency Use Authorization.    Symptom onset: Unkown Vaccinated: Unknown Booster? Unknown Immunocompromised? No Qualifiers: HTN, Rectal Ca  Unable to reach pt - Left message and call back number [phone].  Juan Higgins

## 2020-09-24 NOTE — Progress Notes (Signed)
FOLLOW UP  Assessment and Plan:   Hypertension Recently well controlled off of medications  Monitor blood pressure at home; patient to call if consistently greater than 130/80 Continue DASH diet.   Reminder to go to the ER if any CP, SOB, nausea, dizziness, severe HA, changes vision/speech, left arm numbness and tingling and jaw pain. Keep close follow up in March.   Cholesterol Currently at goal; continue rosuvastatin 20 mg daily  Continue low cholesterol diet and exercise.  Check lipid panel.   Other abnormal glucose Last several A1Cs at goal Continue diet and exercise.  Perform daily foot/skin check, notify office of any concerning changes.  Defer A1C; check CMP/GFR  Obesity with co morbidities Long discussion about weight loss, diet, and exercise Recommended diet heavy in fruits and veggies and low in animal meats, cheeses, and dairy products, appropriate calorie intake Discussed ideal weight for height, initial goal of <200 lb set He will work on cutting portions, sweets, splurge meals Will follow up in 3 months  Vitamin D Def At goal at last visit; continue supplementation to maintain goal of 70-100 Defer Vit D level  Hypogonadism - continue replacement therapy, zinc supplement, reviewed weight loss, encouraged HIIT and resistance exercises, check testosterone levels as needed.   Colostomy (HCC)/history of rectal cancer Doing well without complications.   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 Melford Aase Protect from sun; continue treatment for 3-4 weeks until scabs over Monitor for recurrence;   Continue diet and meds as discussed. Further disposition pending results of labs. Discussed med's effects and SE's.   Over 30 minutes of exam, counseling, chart review, and critical decision making was performed.   Future Appointments  Date Time Provider Accident   01/04/2021 11:00 AM [name], MD [facility] None    ----------------------------------------------------------------------------------------------------------------------  HPI 60 y.o. male  presents for 3 month follow up on hypertension, cholesterol, prediabetes, hypogonadism on testosterone supplementations, obesity and vitamin D deficiency.  He has hx of rectal cancer with resection and colostomy in 2009 - he is doing well since.   He is concerned about recurrent scaly patch to R forehead at hairline for several months.   BMI is Body mass index is 33.15 kg/m., he has been working on diet and exercise. He is working on cutting sweets out at night. Walks daily on treadmill and is pushing vegetables, cutting down on red meats and dairy. Prior to holidays reports was down to 200 lb.  Gets 40-60+ fluid ounces  Wt Readings from Last 3 Encounters:  09/25/20 218 lb (98.9 kg)  05/15/20 213 lb (96.6 kg)  01/20/20 215 lb (97.5 kg)   He has been checking his BP at home (120s/80s), doing well off of medications for several years, today their BP is BP: 130/82  He does workout - he walks 30 mins daily.  He denies chest pain, shortness of breath, dizziness.   He is on cholesterol medication - rosuvastatin 20 mg daily - and denies myalgias. His LDL cholesterol is at goal. The cholesterol last visit was:   Lab Results  Component Value Date   CHOL 138 05/15/2020   HDL 49 05/15/2020   LDLCALC 63 05/15/2020   TRIG 188 (H) 05/15/2020   CHOLHDL 2.8 05/15/2020    He has been working on diet and exercise for glucose management, and denies increased appetite, nausea, paresthesia of the feet, polydipsia, polyuria, visual disturbances and vomiting. Last A1C in the  office was:  Lab Results  Component Value Date   HGBA1C 5.6 05/15/2020   Patient is on Vitamin D supplement and at goal at the last check:    Lab Results  Component Value Date   VD25OH 60 05/15/2020     He has a history of  testosterone deficiency and is on testosterone replacement, using topical testosterone. He states that the testosterone helps with his energy, libido, muscle mass. Lab Results  Component Value Date   TESTOSTERONE 86 (L) 05/15/2020   Patient has dx of gout but not on allopurinol in last few years and does not report a recent flare, he has worked on lifestyle modification (avoiding processed meats and limiting alcohol).  Lab Results  Component Value Date   LABURIC 8.0 05/15/2020     Current Medications:  Current Outpatient Medications on File Prior to Visit  Medication Sig  . aspirin 81 MG tablet Take 81 mg by mouth daily.  . IRON PO Take 500 mg by mouth every other day.  . LOTEMAX 0.5 % GEL   . meloxicam (MOBIC) 15 MG tablet Take 1/2 to 1 tablet Daily with Food for Pain & Inflammation (Patient taking differently: Take 1/2 to 1 tablet as needed with Food for Pain & Inflammation)  . rosuvastatin (CRESTOR) 40 MG tablet Take 1 tablet Daily for Cholesterol  . tadalafil (CIALIS) 20 MG tablet Take 1/2 to 1 tablet every 2 to 3 days as needed for XXXX  . Testosterone 20.25 MG/ACT (1.62%) GEL Apply 2 pumps    Daily     to Skin  . vitamin B-12 (CYANOCOBALAMIN) 500 MCG tablet Take 500 mcg by mouth daily.  Marland Kitchen VITAMIN D PO Take 5,000 Units by mouth 2 (two) times daily.  Marland Kitchen zinc gluconate 50 MG tablet Take 50 mg by mouth daily.   No current facility-administered medications on file prior to visit.     Allergies:  Allergies  Allergen Reactions  . Oxycodone Nausea And Vomiting     Medical History:  Past Medical History:  Diagnosis Date  . Arthritis   . Hyperlipidemia   . Hypertension   . Hypogonadism male   . Pre-diabetes   . 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  HENT: Positive for tinnitus  (Ongoing chronic, bilateral, improved with hearing aids). Negative for hearing loss.   Eyes: Negative for blurred vision and double vision.  Respiratory: Negative for cough, shortness of breath and wheezing.   Cardiovascular: Negative for chest pain, palpitations, orthopnea, claudication and leg swelling.  Gastrointestinal: Negative for abdominal pain, blood in stool, constipation, diarrhea, heartburn, melena, nausea and vomiting.  Genitourinary: Negative.   Musculoskeletal: Negative for joint pain and myalgias.  Skin: Negative for rash.  Neurological: Negative for dizziness, tingling, sensory change, weakness and headaches.  Endo/Heme/Allergies: Negative for polydipsia.  Psychiatric/Behavioral: Negative.   All other systems reviewed and are negative.   Physical Exam: BP 130/82   Pulse 82   Temp (!) 97.2 F (36.2 C)   Ht 5\' 8"  (1.727 m)   Wt 218 lb (98.9 kg)   SpO2 97%   BMI 33.15 kg/m  Wt Readings from Last 3 Encounters:  09/25/20 218 lb (98.9 kg)  05/15/20 213 lb (96.6 kg)  01/20/20 215 lb (97.5 kg)   General Appearance: Well nourished, in no apparent distress. Eyes: PERRLA,  EOMs, conjunctiva no swelling or erythema Sinuses: No Frontal/maxillary tenderness ENT/Mouth: Ext aud canals clear, TMs without erythema, bulging. No erythema, swelling, or exudate on post pharynx.  Tonsils not swollen or erythematous. With bilateral hearing aids.  Neck: Supple, thyroid normal.  Respiratory: Respiratory effort normal, BS equal bilaterally without rales, rhonchi, wheezing or stridor.  Cardio: RRR with no MRGs. Brisk peripheral pulses without edema.  Abdomen: Soft, + BS.  Non tender, no guarding, rebound, masses. Colostomy bag present to left without ulcers, skin breakdown, purulent discharge/mucus.  Lymphatics: Non tender without lymphadenopathy.  Musculoskeletal: Full ROM, 5/5 strength, Normal gait Skin: Warm, dry without rashes, ecchymosis. He has scaly patch approx 1.5 cm to right  forehead at hairline, no erythema or ulceration Neuro: Cranial nerves intact. No cerebellar symptoms.  Psych: Awake and oriented X 3, normal affect, Insight and Judgment appropriate.   Izora Ribas, NP 9:19 AM [facility]

## 2020-09-25 ENCOUNTER — Other Ambulatory Visit: Payer: Self-pay

## 2020-09-25 ENCOUNTER — Ambulatory Visit (INDEPENDENT_AMBULATORY_CARE_PROVIDER_SITE_OTHER): Payer: BC Managed Care – PPO | Admitting: Adult Health

## 2020-09-25 ENCOUNTER — Encounter: Payer: Self-pay | Admitting: Adult Health

## 2020-09-25 VITALS — BP 130/82 | HR 82 | Temp 97.2°F | Ht 68.0 in | Wt 218.0 lb

## 2020-09-25 DIAGNOSIS — L570 Actinic keratosis: Secondary | ICD-10-CM | POA: Insufficient documentation

## 2020-09-25 DIAGNOSIS — E349 Endocrine disorder, unspecified: Secondary | ICD-10-CM

## 2020-09-25 DIAGNOSIS — M1000 Idiopathic gout, unspecified site: Secondary | ICD-10-CM

## 2020-09-25 DIAGNOSIS — E559 Vitamin D deficiency, unspecified: Secondary | ICD-10-CM

## 2020-09-25 DIAGNOSIS — Z933 Colostomy status: Secondary | ICD-10-CM

## 2020-09-25 DIAGNOSIS — E785 Hyperlipidemia, unspecified: Secondary | ICD-10-CM

## 2020-09-25 DIAGNOSIS — E782 Mixed hyperlipidemia: Secondary | ICD-10-CM

## 2020-09-25 DIAGNOSIS — I10 Essential (primary) hypertension: Secondary | ICD-10-CM | POA: Diagnosis not present

## 2020-09-25 DIAGNOSIS — E669 Obesity, unspecified: Secondary | ICD-10-CM | POA: Diagnosis not present

## 2020-09-25 DIAGNOSIS — R7309 Other abnormal glucose: Secondary | ICD-10-CM

## 2020-09-25 DIAGNOSIS — Z79899 Other long term (current) drug therapy: Secondary | ICD-10-CM

## 2020-09-25 MED ORDER — FLUOROURACIL 5 % EX CREA: TOPICAL_CREAM | Freq: Two times a day (BID) | CUTANEOUS | 0 refills | Status: DC

## 2020-09-25 MED ORDER — ROSUVASTATIN CALCIUM 20 MG PO TABS
ORAL_TABLET | ORAL | 3 refills | Status: DC
Start: 2020-09-25 — End: 2020-11-06

## 2020-09-25 NOTE — Patient Instructions (Addendum)
Goals    .  Weight (lb) < 200 lb (90.7 kg) (pt-stated)      End goal 170lb Cut portions, sweets Increase fruits/vegetables Limit splurge meals - I.e. burgers Meal plan for lunch and take with          Apply fluorouracil to forehead scaly area twice daily for 3-4 weeks May ulcerate and scab over - this means the medication is working  IT trainer area from sun exposure with a hat or bandaid  If lots of irritation, can pick up over the counter 1% hydrocortisone cream and apply this twice daily as needed         Can do zinc 40-50 mg a day Can try shake that is whey protein, almond milk, avocado oil, and spinach and strawberries in the morning  9 Ways to Naturally Increase Testosterone Levels  1.   Lose Weight If you're overweight, shedding the excess pounds may increase your testosterone levels, according to research presented at the Endocrine Society's 2012 meeting. Overweight men are more likely to have low testosterone levels to begin with, so this is an important trick to increase your body's testosterone production when you need it most.  2.   High-Intensity Exercise like Peak Fitness  Short intense exercise has a proven positive effect on increasing testosterone levels and preventing its decline. That's unlike aerobics or prolonged moderate exercise, which have shown to have negative or no effect on testosterone levels. Having a whey protein meal after exercise can further enhance the satiety/testosterone-boosting impact (hunger hormones cause the opposite effect on your testosterone and libido). Here's a summary of what a typical high-intensity Peak Fitness routine might look like: " Warm up for three minutes  " Exercise as hard and fast as you can for 30 seconds. You should feel like you couldn't possibly go on another few seconds  " Recover at a slow to moderate pace for 90 seconds  " Repeat the high intensity exercise and recovery 7 more times .  3.   Consume Plenty of  Zinc The mineral zinc is important for testosterone production, and supplementing your diet for as little as six weeks has been shown to cause a marked improvement in testosterone among men with low levels.1 Likewise, research has shown that restricting dietary sources of zinc leads to a significant decrease in testosterone, while zinc supplementation increases it2 -- and even protects men from exercised-induced reductions in testosterone levels.3 It's estimated that up to 45 percent of adults over the age of 60 may have lower than recommended zinc intakes; even when dietary supplements were added in, an estimated 20-25 percent of older adults still had inadequate zinc intakes, according to a [employer] and Nutrition Examination Survey.4 Your diet is the best source of zinc; along with protein-rich foods like meats and fish, other good dietary sources of zinc include raw milk, raw cheese, beans, and yogurt or kefir made from raw milk. It can be difficult to obtain enough dietary zinc if you're a vegetarian, and also for meat-eaters as well, largely because of conventional farming methods that rely heavily on chemical fertilizers and pesticides. These chemicals deplete the soil of nutrients ... nutrients like zinc that must be absorbed by plants in order to be passed on to you. In many cases, you may further deplete the nutrients in your food by the way you prepare it. For most food, cooking it will drastically reduce its levels of nutrients like zinc ... particularly over-cooking, which many people do. If  you decide to use a zinc supplement, stick to a dosage of less than 40 mg a day, as this is the recommended adult upper limit. Taking too much zinc can interfere with your body's ability to absorb other minerals, especially copper, and may cause nausea as a side effect.  4.   Strength Training In addition to Peak Fitness, strength training is also known to boost testosterone levels, provided you are  doing so intensely enough. When strength training to boost testosterone, you'll want to increase the weight and lower your number of reps, and then focus on exercises that work a large number of muscles, such as dead lifts or squats.  You can "turbo-charge" your weight training by going slower. By slowing down your movement, you're actually turning it into a high-intensity exercise. Super Slow movement allows your muscle, at the microscopic level, to access the maximum number of cross-bridges between the protein filaments that produce movement in the muscle.   5.   Optimize Your Vitamin D Levels Vitamin D, a steroid hormone, is essential for the healthy development of the nucleus of the sperm cell, and helps maintain semen quality and sperm count. Vitamin D also increases levels of testosterone, which may boost libido. In one study, overweight men who were given vitamin D supplements had a significant increase in testosterone levels after one year.5   6.   Reduce Stress When you're under a lot of stress, your body releases high levels of the stress hormone cortisol. This hormone actually blocks the effects of testosterone,6 presumably because, from a biological standpoint, testosterone-associated behaviors (mating, competing, aggression) may have lowered your chances of survival in an emergency (hence, the "fight or flight" response is dominant, courtesy of cortisol).  7.   Limit or Eliminate Sugar from Your Diet Testosterone levels decrease after you eat sugar, which is likely because the sugar leads to a high insulin level, another factor leading to low testosterone.7 Based on USDA estimates, the average American consumes 12 teaspoons of sugar a day, which equates to about TWO TONS of sugar during a lifetime.  8.   Eat Healthy Fats By healthy, this means not only mon- and polyunsaturated fats, like that found in avocadoes and nuts, but also saturated, as these are essential for building testosterone.  Research shows that a diet with less than 40 percent of energy as fat (and that mainly from animal sources, i.e. saturated) lead to a decrease in testosterone levels.8 My personal diet is about 60-70 percent healthy fat, and other experts agree that the ideal diet includes somewhere between 50-70 percent fat.  It's important to understand that your body requires saturated fats from animal and vegetable sources (such as meat, dairy, certain oils, and tropical plants like coconut) for optimal functioning, and if you neglect this important food group in favor of sugar, grains and other starchy carbs, your health and weight are almost guaranteed to suffer. Examples of healthy fats you can eat more of to give your testosterone levels a boost include: Olives and Olive oil  Coconuts and coconut oil Butter made from raw grass-fed organic milk Raw nuts, such as, almonds or pecans Organic pastured egg yolks Avocados Grass-fed meats Palm oil Unheated organic nut oils   9.   Boost Your Intake of Branch Chain Amino Acids (BCAA) from Foods Like [place] suggests that BCAAs result in higher testosterone levels, particularly when taken along with resistance training.9 While BCAAs are available in supplement form, you'll find the highest concentrations  of BCAAs like leucine in dairy products - especially quality cheeses and whey protein. Even when getting leucine from your natural food supply, it's often wasted or used as a building block instead of an anabolic agent. So to create the correct anabolic environment, you need to boost leucine consumption way beyond mere maintenance levels. That said, keep in mind that using leucine as a free form amino acid can be highly counterproductive as when free form amino acids are artificially administrated, they rapidly enter your circulation while disrupting insulin function, and impairing your body's glycemic control. Food-based leucine is really the ideal form that  can benefit your muscles without side effects.         Fluorouracil, 5-FU skin cream or solution What is this medicine? FLUOROURACIL, 5-FU (flure oh YOOR a sil) is a chemotherapy agent. It is used on the skin to treat skin cancer and certain types of skin conditions that could become cancer. This medicine may be used for other purposes; ask your health care provider or pharmacist if you have questions. COMMON BRAND NAME(S): Carac, Efudex, Fluoroplex, Tolak What should I tell my health care provider before I take this medicine? They need to know if you have any of these conditions:  dihydropyrimidine dehydrogenase (DPD) deficiency  an unusual or allergic reaction to fluorouracil, other chemotherapy, other medicines, foods, dyes, or preservatives  pregnant or trying to get pregnant  breast-feeding How should I use this medicine? This medicine is only for use on the skin. Follow the directions on the prescription label. Wash hands before and after use. Wash affected area and gently pat dry. To apply this medicine use a cotton-tipped applicator, or use gloves if applying with fingertips. If applied with unprotected fingertips, it is very important to wash your hands well after you apply this medicine. Avoid applying to the eyes, nose, or mouth. Apply enough medicine to cover the affected area. You can cover the area with a light gauze dressing, but do not use tight or air-tight dressings. Finish the full course prescribed by your doctor or health care professional, even if you think your condition is better. Do not stop taking except on the advice of your doctor or health care professional. Talk to your pediatrician regarding the use of this medicine in children. Special care may be needed. Overdosage: If you think you have taken too much of this medicine contact a poison control center or emergency room at once. NOTE: This medicine is only for you. Do not share this medicine with  others. What if I miss a dose? If you miss a dose, apply it as soon as you can. If it is almost time for your next dose, only use that dose. Do not apply extra doses. Contact your doctor or health care professional if you miss more than one dose. What may interact with this medicine? Interactions are not expected. Do not use any other skin products without telling your doctor or health care professional. This list may not describe all possible interactions. Give your health care provider a list of all the medicines, herbs, non-prescription drugs, or dietary supplements you use. Also tell them if you smoke, drink alcohol, or use illegal drugs. Some items may interact with your medicine. What should I watch for while using this medicine? Visit your doctor or health care professional for checks on your progress. You will need to use this medicine for 2 to 6 weeks. This may be longer depending on the condition being treated. You may  not see full healing for another 1 to 2 months after you stop using the medicine. Treated areas of skin can look unsightly during and for several weeks after treatment with this medicine. Do not get this medicine in your eyes. If you do, rinse out with plenty of cool tap water. This medicine can make you more sensitive to the sun. Keep out of the sun. If you cannot avoid being in the sun, wear protective clothing and use sunscreen. Do not use sun lamps or tanning beds/booths. Serious side effects or death can occur if a pet comes into contact with this drug. Contact a vet right away if a pet touches or licks the drug on your skin or comes into contact with the container. Throw away or wash any items used to apply this drug. Wash your hands after applying the drug. Make sure the drug does not get on clothing, carpet, or furniture. If you cannot avoid skin to skin contact with your pet, ask your health care provider if you can cover the area(s) where you apply this drug. Do not  become pregnant while taking this medicine or for 1 month after stopping it. Women should inform their doctor if they wish to become pregnant or think they might be pregnant. There is a potential for serious side effects to an unborn child. Talk to your health care professional or pharmacist for more information. What side effects may I notice from receiving this medicine? Side effects that you should report to your doctor or health care professional as soon as possible:  allergic reactions like skin rash, itching or hives, swelling of the face, lips, or tongue  bloody diarrhea  fever or chills  stomach pain  vomiting Side effects that usually do not require medical attention (report to your doctor or health care professional if they continue or are bothersome):  redness or dry skin  sensitivity to light This list may not describe all possible side effects. Call your doctor for medical advice about side effects. You may report side effects to FDA at 1-800-FDA-1088. Where should I keep my medicine? Keep out of the reach of children and pets. See product for storage instructions. Each product may have different instructions. Throw away any unused medicine after the expiration date. NOTE: This sheet is a summary. It may not cover all possible information. If you have questions about this medicine, talk to your doctor, pharmacist, or health care provider.  2021 Elsevier/Gold Standard (2019-07-26 15:55:50)

## 2020-09-26 LAB — CBC WITH DIFFERENTIAL/PLATELET
Absolute Monocytes: 450 cells/uL (ref 200–950)
Basophils Absolute: 23 cells/uL (ref 0–200)
Basophils Relative: 0.4 %
Eosinophils Absolute: 80 cells/uL (ref 15–500)
Eosinophils Relative: 1.4 %
HCT: 44.3 % (ref 38.5–50.0)
Hemoglobin: 14.8 g/dL (ref 13.2–17.1)
Lymphs Abs: 1425 cells/uL (ref 850–3900)
MCH: 28.4 pg (ref 27.0–33.0)
MCHC: 33.4 g/dL (ref 32.0–36.0)
MCV: 85 fL (ref 80.0–100.0)
MPV: 11.6 fL (ref 7.5–12.5)
Monocytes Relative: 7.9 %
Neutro Abs: 3722 cells/uL (ref 1500–7800)
Neutrophils Relative %: 65.3 %
Platelets: 186 10*3/uL (ref 140–400)
RBC: 5.21 10*6/uL (ref 4.20–5.80)
RDW: 13.9 % (ref 11.0–15.0)
Total Lymphocyte: 25 %
WBC: 5.7 10*3/uL (ref 3.8–10.8)

## 2020-09-26 LAB — LIPID PANEL
Cholesterol: 142 mg/dL (ref <200)
HDL: 49 mg/dL (ref >=40)
LDL Cholesterol (Calc): 75 mg/dL (calc)
Non-HDL Cholesterol (Calc): 93 mg/dL (calc) (ref <130)
Total CHOL/HDL Ratio: 2.9 (calc) (ref <5.0)
Triglycerides: 92 mg/dL (ref <150)

## 2020-09-26 LAB — COMPLETE METABOLIC PANEL WITH GFR
AG Ratio: 2 (calc) (ref 1.0–2.5)
ALT: 23 U/L (ref 9–46)
AST: 16 U/L (ref 10–35)
Albumin: 4.5 g/dL (ref 3.6–5.1)
Alkaline phosphatase (APISO): 46 U/L (ref 35–144)
BUN: 16 mg/dL (ref 7–25)
CO2: 25 mmol/L (ref 20–32)
Calcium: 9.5 mg/dL (ref 8.6–10.3)
Chloride: 105 mmol/L (ref 98–110)
Creat: 0.88 mg/dL (ref 0.70–1.25)
GFR, Est African American: 107 mL/min/{1.73_m2} (ref >=60)
GFR, Est Non African American: 93 mL/min/{1.73_m2} (ref >=60)
Globulin: 2.2 g/dL (calc) (ref 1.9–3.7)
Glucose, Bld: 86 mg/dL (ref 65–99)
Potassium: 4.7 mmol/L (ref 3.5–5.3)
Sodium: 140 mmol/L (ref 135–146)
Total Bilirubin: 0.8 mg/dL (ref 0.2–1.2)
Total Protein: 6.7 g/dL (ref 6.1–8.1)

## 2020-09-26 LAB — TSH: TSH: 1.65 mIU/L (ref 0.40–4.50)

## 2020-11-06 ENCOUNTER — Other Ambulatory Visit: Payer: Self-pay | Admitting: Internal Medicine

## 2020-11-06 ENCOUNTER — Other Ambulatory Visit: Payer: Self-pay | Admitting: Adult Health

## 2020-11-06 DIAGNOSIS — E349 Endocrine disorder, unspecified: Secondary | ICD-10-CM

## 2020-11-06 DIAGNOSIS — E782 Mixed hyperlipidemia: Secondary | ICD-10-CM

## 2020-11-06 MED ORDER — ROSUVASTATIN CALCIUM 20 MG PO TABS: ORAL_TABLET | ORAL | 3 refills | Status: DC

## 2021-01-04 ENCOUNTER — Encounter: Payer: BC Managed Care – PPO | Admitting: Internal Medicine

## 2021-02-03 ENCOUNTER — Other Ambulatory Visit: Payer: Self-pay | Admitting: Internal Medicine

## 2021-02-03 DIAGNOSIS — Z79899 Other long term (current) drug therapy: Secondary | ICD-10-CM

## 2021-02-03 DIAGNOSIS — E782 Mixed hyperlipidemia: Secondary | ICD-10-CM

## 2021-02-12 ENCOUNTER — Other Ambulatory Visit: Payer: Self-pay | Admitting: Internal Medicine

## 2021-02-12 DIAGNOSIS — E782 Mixed hyperlipidemia: Secondary | ICD-10-CM

## 2021-03-05 ENCOUNTER — Encounter: Payer: Self-pay | Admitting: Internal Medicine

## 2021-03-05 DIAGNOSIS — R0989 Other specified symptoms and signs involving the circulatory and respiratory systems: Secondary | ICD-10-CM | POA: Insufficient documentation

## 2021-03-05 NOTE — Progress Notes (Signed)
Annual  Screening/Preventative Visit  & Comprehensive Evaluation & Examination  Future Appointments  Date Time Provider [place]  03/06/2021 10:00 AM [name], MD [facility] None  03/06/2022 10:00 AM [name], MD [facility] None            This very nice 60 y.o. MWM presents for a Screening /Preventative Visit & comprehensive evaluation and management of multiple medical co-morbidities.  Patient has been followed for HTN, HLD, Prediabetes and Vitamin D Deficiency. 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  Next scheduled colonoscopy w/Dr Ardis Hughs is in May 2023.        HTN predates circa 2012. Patient's BP has been controlled at home.  Today's BP is at goal - 117/73. Patient denies any cardiac symptoms as chest pain, palpitations, shortness of breath, dizziness or ankle swelling.       Patient's hyperlipidemia is controlled with diet and Rosuvastatin. Patient denies myalgias or other medication SE's. Last lipids were at goal:  Lab Results  Component Value Date   CHOL 142 09/25/2020   HDL 49 09/25/2020   LDLCALC 75 09/25/2020   TRIG 92 09/25/2020   CHOLHDL 2.9 09/25/2020         Patient has moderate obesity (BMI 33+) & consequent  prediabetes (A1c 5.7% /2012).  Patient denies reactive hypoglycemic symptoms, visual blurring, diabetic polys or paresthesias. Last A1c was normal & at goal:   Lab Results  Component Value Date   HGBA1C 5.6 05/15/2020             Patient has hx/o Testosterone  Deficiency ("260" /2012) & is on replacement by gel with improved stamina & sense of well being.  Finally, patient has history of Vitamin D Deficiency ("14" /2009) and last vitamin D was at goal:   Lab Results  Component Value Date   VD25OH 60 05/15/2020     Current Outpatient Medications on File Prior to Visit  Medication Sig   aspirin 81 MG tablet Take 81 mg by mouth daily.   fluorouracil (EFUDEX) 5 % cream Apply topically 2  (two) times daily. To forehead lesion for 3-4 weeks.   IRON PO Take 500 mg by mouth every other day.   LOTEMAX 0.5 % GEL    meloxicam (MOBIC) 15 MG tablet Take 1/2 to 1 tablet Daily with Food for Pain & Inflammation (   rosuvastatin (CRESTOR) 20 MG tablet Take 1 tablet Daily for Cholesterol   tadalafil (CIALIS) 20 MG tablet Take  1/2 to 1 tablet  every 2 to 3 days  as needed  for XXXX   Testosterone 20.25 MG/ACT (1.62%) GEL APPLY 2 PUMPS TO THE SKIN DAILY   vitamin B-12 500 MCG tablet Take 500 mcg by mouth daily.   VITAMIN D PO Take 5,000 Units by mouth 2 (two) times daily.   zinc gluconate 50 MG tablet Take 50 mg by mouth daily.     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   colostomy    Health Maintenance  Topic Date Due   Pneumococcal Vaccine Never done   Zoster Vaccines- Shingrix (1 of 2) Never done   TETANUS/TDAP  02/16/2018   COVID-19 Vaccine (3 - Pfizer risk series) 03/04/2020  INFLUENZA VACCINE  04/01/2021   COLONOSCOPY 10/21/2021   Hepatitis C Screening  Completed   HIV Screening  Completed   HPV VACCINES  Aged Out    Immunization History  Administered Date(s) Administered   PFIZER Comirnaty Covid-19 Tri-Sucrose Vaccine 01/15/2020, 02/05/2020   PPD Test 09/09/2013, 09/11/2014, 09/20/2015, 04/16/2017, 05/18/2018   Tdap 02/17/2008    Last Colon - 10/21/2016 - Dr Ardis Hughs - 5 yr  F/u due 10/2021.  Past Surgical History:  Procedure Laterality Date   ABDOMINOPERINEAL PROCTOCOLECTOMY N/A 07/18/2008   Dr Morton Stall, [facility].  Very distal rectal cancer   colonoscopsy  09/2011   PARASTOMAL HERNIA REPAIR  2010   Dr Lenise Arena,  [facility]   RECONSTRUCT / STABILIZE DISTAL ULNA     WISDOM TOOTH EXTRACTION       Family History  Problem Relation Age of Onset   COPD Mother    Alcohol abuse Father     Cirrhosis Father    COPD Sister    Alcohol abuse Brother    Diabetes Maternal Grandmother    Colon cancer Neg Hx     Social History   Socioeconomic History   Marital status: Married    Spouse name: Not on file   Number of children: Not on file  Occupational History   Not on file  Tobacco Use   Smoking status: Never   Smokeless tobacco: Never  Substance and Sexual Activity   Alcohol use: Yes    Alcohol/week: 4.0 standard drinks    Types: 4 Glasses of wine per week    Comment: 4   Drug use: No   Sexual activity: Not on file    ROS Constitutional: Denies fever, chills, weight loss/gain, headaches, insomnia,  night sweats or change in appetite. Does c/o fatigue. Eyes: Denies redness, blurred vision, diplopia, discharge, itchy or watery eyes.  ENT: Denies discharge, congestion, post nasal drip, epistaxis, sore throat, earache, hearing loss, dental pain, Tinnitus, Vertigo, Sinus pain or snoring.  Cardio: Denies chest pain, palpitations, irregular heartbeat, syncope, dyspnea, diaphoresis, orthopnea, PND, claudication or edema Respiratory: denies cough, dyspnea, DOE, pleurisy, hoarseness, laryngitis or wheezing.  Gastrointestinal: Denies dysphagia, heartburn, reflux, water brash, pain, cramps, nausea, vomiting, bloating, diarrhea, constipation, hematemesis, melena, hematochezia, jaundice or hemorrhoids Genitourinary: Denies dysuria, frequency, urgency, nocturia, hesitancy, discharge, hematuria or flank pain Musculoskeletal: Denies arthralgia, myalgia, stiffness, Jt. Swelling, pain, limp or strain/sprain. Denies Falls. Skin: Denies puritis, rash, hives, warts, acne, eczema or change in skin lesion Neuro: No weakness, tremor, incoordination, spasms, paresthesia or pain Psychiatric: Denies confusion, memory loss or sensory loss. Denies Depression. Endocrine: Denies change in weight, skin, hair change, nocturia, and paresthesia, diabetic polys, visual blurring or hyper / hypo glycemic  episodes.  Heme/Lymph: No excessive bleeding, bruising or enlarged lymph nodes.   Physical Exam  BP 117/73   Pulse 82   Temp 97.7 F (36.5 C)   Resp 17   Ht 5\' 8"  (1.727 m)   Wt 218 lb (98.9 kg)   SpO2 95%   BMI 33.15 kg/m   General Appearance: Over nourished and well groomed and in no apparent distress.  Eyes: PERRLA, EOMs, conjunctiva no swelling or erythema, normal fundi and vessels. Sinuses: No frontal/maxillary tenderness ENT/Mouth: EACs patent / TMs  nl. Nares clear without erythema, swelling, mucoid exudates. Oral hygiene is good. No erythema, swelling, or exudate. Tongue normal, non-obstructing. Tonsils not swollen or erythematous. Hearing normal.  Neck: Supple, thyroid not palpable. No bruits, nodes or JVD. Respiratory: Respiratory effort normal.  BS  equal and clear bilateral without rales, rhonci, wheezing or stridor. Cardio: Heart sounds are normal with regular rate and rhythm and no murmurs, rubs or gallops. Peripheral pulses are normal and equal bilaterally without edema. No aortic or femoral bruits. Chest: symmetric with normal excursions and percussion.  Abdomen: Soft, with Nl bowel sounds.  LLQ colostomy.  Moderate laxity of the abdominal musculature with rectus diastasis.  Non tender, no guarding, rebound, hernias, masses, or organomegaly.  Lymphatics: Non tender without lymphadenopathy.  Musculoskeletal: Full ROM all peripheral extremities, joint stability, 5/5 strength, and normal gait. Skin: Warm and dry without rashes, lesions, cyanosis, clubbing or  ecchymosis.  Neuro: Cranial nerves intact, reflexes equal bilaterally. Normal muscle tone, no cerebellar symptoms. Sensation intact.  Pysch: Alert and oriented X 3 with normal affect, insight and judgment appropriate.   Assessment and Plan  1. Annual Preventative/Screening Exam   2. Labile hypertension  - EKG 12-Lead - [place], RETROPERITNL ABD,  LTD - Urinalysis, Routine w reflex microscopic - Microalbumin /  creatinine urine ratio - CBC with Differential/Platelet - COMPLETE METABOLIC PANEL WITH GFR - Magnesium - TSH  3. Hyperlipidemia, mixed  - EKG 12-Lead - [place], RETROPERITNL ABD,  LTD - Lipid panel - TSH  4. Abnormal glucose  - EKG 12-Lead - [place], RETROPERITNL ABD,  LTD - Hemoglobin A1c - Insulin, random  5. Vitamin D deficiency  - VITAMIN D 25 Hydroxy 6. Testosterone deficiency  - Testosterone  7. Idiopathic gout  - Uric acid  8. Screening-pulmonary TB  - TB Skin Test  9. Colostomy  ([place])   10. Screening for colorectal cancer  - POC Hemoccult Bld/Stl  11. Prostate cancer screening  - PSA  12. Screening for ischemic heart disease  - EKG 12-Lead  13. Screening for AAA (aortic abdominal aneurysm)  - [place], RETROPERITNL ABD,  LTD  14. Fatigue, unspecified type  - Urinalysis, Routine w reflex microscopic - Microalbumin / creatinine urine ratio - Iron, Total/Total Iron Binding Cap - Vitamin B12 - Testosterone - CBC with Differential/Platelet - TSH  15. Medication management  - CBC with Differential/Platelet - COMPLETE METABOLIC PANEL WITH GFR - Magnesium - Lipid panel - TSH - Hemoglobin A1c - Insulin, random - VITAMIN D 25 Hydroxy          Patient was counseled in prudent diet, weight control to achieve/maintain BMI less than 25, BP monitoring, regular exercise and medications as discussed.  Discussed med effects and SE's. Routine screening labs and tests as requested with regular follow-up as recommended. Over 40 minutes of exam, counseling, chart review and high complex critical decision making was performed   Kirtland Bouchard, MD

## 2021-03-05 NOTE — Patient Instructions (Signed)

## 2021-03-06 ENCOUNTER — Encounter: Payer: Self-pay | Admitting: Internal Medicine

## 2021-03-06 ENCOUNTER — Other Ambulatory Visit: Payer: Self-pay

## 2021-03-06 ENCOUNTER — Ambulatory Visit (INDEPENDENT_AMBULATORY_CARE_PROVIDER_SITE_OTHER): Payer: BC Managed Care – PPO | Admitting: Internal Medicine

## 2021-03-06 VITALS — BP 117/73 | HR 82 | Temp 97.7°F | Resp 17 | Ht 68.0 in | Wt 218.0 lb

## 2021-03-06 DIAGNOSIS — E559 Vitamin D deficiency, unspecified: Secondary | ICD-10-CM | POA: Diagnosis not present

## 2021-03-06 DIAGNOSIS — Z79899 Other long term (current) drug therapy: Secondary | ICD-10-CM

## 2021-03-06 DIAGNOSIS — Z13220 Encounter for screening for lipoid disorders: Secondary | ICD-10-CM

## 2021-03-06 DIAGNOSIS — Z125 Encounter for screening for malignant neoplasm of prostate: Secondary | ICD-10-CM

## 2021-03-06 DIAGNOSIS — Z130 Encounter for screening for diseases of the blood and blood-forming organs and certain disorders involving the immune mechanism: Secondary | ICD-10-CM | POA: Diagnosis not present

## 2021-03-06 DIAGNOSIS — I10 Essential (primary) hypertension: Secondary | ICD-10-CM

## 2021-03-06 DIAGNOSIS — Z131 Encounter for screening for diabetes mellitus: Secondary | ICD-10-CM

## 2021-03-06 DIAGNOSIS — Z1389 Encounter for screening for other disorder: Secondary | ICD-10-CM

## 2021-03-06 DIAGNOSIS — R350 Frequency of micturition: Secondary | ICD-10-CM | POA: Diagnosis not present

## 2021-03-06 DIAGNOSIS — Z0000 Encounter for general adult medical examination without abnormal findings: Secondary | ICD-10-CM

## 2021-03-06 DIAGNOSIS — R5383 Other fatigue: Secondary | ICD-10-CM

## 2021-03-06 DIAGNOSIS — Z0001 Encounter for general adult medical examination with abnormal findings: Secondary | ICD-10-CM

## 2021-03-06 DIAGNOSIS — N401 Enlarged prostate with lower urinary tract symptoms: Secondary | ICD-10-CM

## 2021-03-06 DIAGNOSIS — Z933 Colostomy status: Secondary | ICD-10-CM

## 2021-03-06 DIAGNOSIS — Z136 Encounter for screening for cardiovascular disorders: Secondary | ICD-10-CM | POA: Diagnosis not present

## 2021-03-06 DIAGNOSIS — M1000 Idiopathic gout, unspecified site: Secondary | ICD-10-CM

## 2021-03-06 DIAGNOSIS — E782 Mixed hyperlipidemia: Secondary | ICD-10-CM

## 2021-03-06 DIAGNOSIS — R0989 Other specified symptoms and signs involving the circulatory and respiratory systems: Secondary | ICD-10-CM

## 2021-03-06 DIAGNOSIS — E669 Obesity, unspecified: Secondary | ICD-10-CM

## 2021-03-06 DIAGNOSIS — Z111 Encounter for screening for respiratory tuberculosis: Secondary | ICD-10-CM

## 2021-03-06 DIAGNOSIS — E349 Endocrine disorder, unspecified: Secondary | ICD-10-CM

## 2021-03-06 DIAGNOSIS — R7309 Other abnormal glucose: Secondary | ICD-10-CM

## 2021-03-06 DIAGNOSIS — Z1329 Encounter for screening for other suspected endocrine disorder: Secondary | ICD-10-CM | POA: Diagnosis not present

## 2021-03-06 DIAGNOSIS — Z1211 Encounter for screening for malignant neoplasm of colon: Secondary | ICD-10-CM

## 2021-03-06 MED ORDER — PHENTERMINE HCL 37.5 MG PO TABS: ORAL_TABLET | ORAL | 1 refills | Status: DC

## 2021-03-06 MED ORDER — TOPIRAMATE 50 MG PO TABS: ORAL_TABLET | ORAL | 1 refills | Status: DC

## 2021-03-07 LAB — CBC WITH DIFFERENTIAL/PLATELET
Absolute Monocytes: 339 cells/uL (ref 200–950)
Basophils Absolute: 32 cells/uL (ref 0–200)
Basophils Relative: 0.5 %
Eosinophils Absolute: 109 cells/uL (ref 15–500)
Eosinophils Relative: 1.7 %
HCT: 46.3 % (ref 38.5–50.0)
Hemoglobin: 15.2 g/dL (ref 13.2–17.1)
Lymphs Abs: 1715 cells/uL (ref 850–3900)
MCH: 27.6 pg (ref 27.0–33.0)
MCHC: 32.8 g/dL (ref 32.0–36.0)
MCV: 84 fL (ref 80.0–100.0)
MPV: 11.7 fL (ref 7.5–12.5)
Monocytes Relative: 5.3 %
Neutro Abs: 4205 cells/uL (ref 1500–7800)
Neutrophils Relative %: 65.7 %
Platelets: 185 10*3/uL (ref 140–400)
RBC: 5.51 10*6/uL (ref 4.20–5.80)
RDW: 13.7 % (ref 11.0–15.0)
Total Lymphocyte: 26.8 %
WBC: 6.4 10*3/uL (ref 3.8–10.8)

## 2021-03-07 LAB — COMPLETE METABOLIC PANEL WITH GFR
AG Ratio: 1.9 (calc) (ref 1.0–2.5)
ALT: 24 U/L (ref 9–46)
AST: 17 U/L (ref 10–35)
Albumin: 4.3 g/dL (ref 3.6–5.1)
Alkaline phosphatase (APISO): 47 U/L (ref 35–144)
BUN: 14 mg/dL (ref 7–25)
CO2: 27 mmol/L (ref 20–32)
Calcium: 9.7 mg/dL (ref 8.6–10.3)
Chloride: 105 mmol/L (ref 98–110)
Creat: 0.95 mg/dL (ref 0.70–1.25)
GFR, Est African American: 100 mL/min/{1.73_m2} (ref >=60)
GFR, Est Non African American: 86 mL/min/{1.73_m2} (ref >=60)
Globulin: 2.3 g/dL (calc) (ref 1.9–3.7)
Glucose, Bld: 119 mg/dL — ABNORMAL HIGH (ref 65–99)
Potassium: 4.5 mmol/L (ref 3.5–5.3)
Sodium: 140 mmol/L (ref 135–146)
Total Bilirubin: 0.8 mg/dL (ref 0.2–1.2)
Total Protein: 6.6 g/dL (ref 6.1–8.1)

## 2021-03-07 LAB — HEMOGLOBIN A1C
Hgb A1c MFr Bld: 5.5 % of total Hgb (ref <5.7)
Mean Plasma Glucose: 111 mg/dL
eAG (mmol/L): 6.2 mmol/L

## 2021-03-07 LAB — URINALYSIS, ROUTINE W REFLEX MICROSCOPIC
Bilirubin Urine: NEGATIVE
Glucose, UA: NEGATIVE
Hgb urine dipstick: NEGATIVE
Ketones, ur: NEGATIVE
Leukocytes,Ua: NEGATIVE
Nitrite: NEGATIVE
Protein, ur: NEGATIVE
Specific Gravity, Urine: 1.021 (ref 1.001–1.035)
pH: 5.5 (ref 5.0–8.0)

## 2021-03-07 LAB — MAGNESIUM: Magnesium: 2.2 mg/dL (ref 1.5–2.5)

## 2021-03-07 LAB — VITAMIN D 25 HYDROXY (VIT D DEFICIENCY, FRACTURES): Vit D, 25-Hydroxy: 82 ng/mL (ref 30–100)

## 2021-03-07 LAB — PSA: PSA: 0.24 ng/mL (ref <=4.00)

## 2021-03-07 LAB — TESTOSTERONE: Testosterone: 95 ng/dL — ABNORMAL LOW (ref 250–827)

## 2021-03-07 LAB — LIPID PANEL
Cholesterol: 142 mg/dL (ref <200)
HDL: 47 mg/dL (ref >=40)
LDL Cholesterol (Calc): 73 mg/dL (calc)
Non-HDL Cholesterol (Calc): 95 mg/dL (calc) (ref <130)
Total CHOL/HDL Ratio: 3 (calc) (ref <5.0)
Triglycerides: 140 mg/dL (ref <150)

## 2021-03-07 LAB — IRON, TOTAL/TOTAL IRON BINDING CAP
%SAT: 26 % (calc) (ref 20–48)
Iron: 77 ug/dL (ref 50–180)
TIBC: 292 mcg/dL (calc) (ref 250–425)

## 2021-03-07 LAB — MICROALBUMIN / CREATININE URINE RATIO
Creatinine, Urine: 168 mg/dL (ref 20–320)
Microalb, Ur: <0.2 mg/dL

## 2021-03-07 LAB — TSH: TSH: 2.18 mIU/L (ref 0.40–4.50)

## 2021-03-07 LAB — VITAMIN B12: Vitamin B-12: 1018 pg/mL (ref 200–1100)

## 2021-03-07 LAB — INSULIN, RANDOM: Insulin: 127.7 u[IU]/mL — ABNORMAL HIGH

## 2021-03-07 LAB — URIC ACID: Uric Acid, Serum: 7.3 mg/dL (ref 4.0–8.0)

## 2021-03-09 NOTE — Progress Notes (Signed)
============================================================ -   Test results slightly outside the reference range are not unusual. If there is anything important, I will review this with you,  otherwise it is considered normal test values.  If you have further questions,  please do not hesitate to contact me at the office or via My Chart.  ============================================================ ============================================================  -  Iron & Vitamin B12 levels - Both Normal & OK  ============================================================ ============================================================  -  PSA - Low - Great  ! ============================================================ ============================================================  -  Testosterone Low  - ?  Missed dose ? ============================================================ ============================================================  -  Uric Acid / Gout test is Normal & OK  ============================================================ ============================================================  -  Total Chol = 142  and LDL Chol = 173 p- Both  Excellent   -  Very low risk for Heart Attack  / Stroke ============================================================ ============================================================  -  A1c - Normal - Great - No Diabetes ! ============================================================ ============================================================  -  Insulin Level is elevated most likely consequent of                                             having eaten in the 2-3 hours before blood drawn  ============================================================ ============================================================  -  Vitamin D =- 82 - Excellent   ============================================================ ============================================================  -  All Else - CBC - Kidneys - Electrolytes - Liver - Magnesium & Thyroid    - all  Normal / OK ============================================================ ============================================================

## 2021-08-01 ENCOUNTER — Encounter: Payer: Self-pay | Admitting: Internal Medicine

## 2021-09-04 NOTE — Progress Notes (Signed)
FOLLOW UP  Assessment and Plan:   Hypertension Recently well controlled off of medications  Monitor blood pressure at home; patient to call if consistently greater than 130/80 Continue DASH diet.   Reminder to go to the ER if any CP, SOB, nausea, dizziness, severe HA, changes vision/speech, left arm numbness and tingling and jaw pain. Check CBC  Cholesterol Currently at goal; continue rosuvastatin 20 mg daily  Continue low cholesterol diet and exercise.  Check lipid panel.  Check CMP  Other abnormal glucose Last several A1Cs at goal Continue diet and exercise.  Perform daily foot/skin check, notify office of any concerning changes.  Defer A1C; check CMP/GFR  Obesity with co morbidities Long discussion about weight loss, diet, and exercise Recommended diet heavy in fruits and veggies and low in animal meats, cheeses, and dairy products, appropriate calorie intake Continue Phentermine and Topamax Will follow up in 3 months  Vitamin D Def At goal at last visit; continue supplementation to maintain goal of 70-100 Defer Vit D level  Hypogonadism - continue replacement therapy, zinc supplement, reviewed weight loss, encouraged HIIT and resistance exercises, check testosterone level.  If testosterone level remains low will consider changing to nasal testosterone or injections  Colostomy ([facility])/history of rectal cancer Doing well without complications.  Herniation on one side.   Chronic Left Shoulder Pain Will take Meloxicam daily x 2 weeks, if no alleviation of pain will get xray and possibly MRI   Continue diet and meds as discussed. Further disposition pending results of labs. Discussed med's effects and SE's.   Over 30 minutes of exam, counseling, chart review, and critical decision making was performed.   Future Appointments  Date Time Provider [place]  03/06/2022 10:00 AM [name], MD [facility] None     ----------------------------------------------------------------------------------------------------------------------  HPI 61 y.o. male  presents for 3 month follow up on hypertension, cholesterol, prediabetes, hypogonadism on testosterone supplementations, obesity and vitamin D deficiency.  He has hx of rectal cancer with resection and colostomy in 2009 - he is doing well since.   He is having Left shoulder pain which has been occurring x 6 months.  He describes it as a sharp pain in the shoulder joint which is worse when lifting something overhead.   BMI is Body mass index is 31.81 kg/m., he has been working on diet and exercise.  He is working on diet, using Phentermine and Topamax. He is walking daily.  Wt Readings from Last 3 Encounters:  09/06/21 209 lb 3.2 oz (94.9 kg)  03/06/21 218 lb (98.9 kg)  09/25/20 218 lb (98.9 kg)   He has not been checking his BP at home, doing well off of medications for several years, today their BP is BP: 128/82 BP Readings from Last 3 Encounters:  09/06/21 128/82  03/06/21 117/73  09/25/20 130/82     He does workout - he walks 30 mins daily.  He denies chest pain, shortness of breath, dizziness.   He is on cholesterol medication - rosuvastatin 20 mg daily - and denies myalgias. His LDL cholesterol is at goal. The cholesterol last visit was:   Lab Results  Component Value Date   CHOL 142 03/06/2021   HDL 47 03/06/2021   LDLCALC 73 03/06/2021   TRIG 140 03/06/2021   CHOLHDL 3.0 03/06/2021    He has been working on diet and exercise for glucose management, and denies increased appetite, nausea, paresthesia of the feet, polydipsia, polyuria, visual disturbances and vomiting. Last A1C in the office was:  Lab Results  Component Value Date   HGBA1C 5.5 03/06/2021   Patient is on Vitamin D supplement and at goal at the last check:    Lab Results  Component Value Date   VD25OH 82 03/06/2021     He has a history of testosterone deficiency  and is on testosterone replacement, using topical testosterone. He states that the testosterone helps with his energy, libido, muscle mass. Lab Results  Component Value Date   TESTOSTERONE 95 (L) 03/06/2021   Patient has dx of gout but not on allopurinol in last few years and does not report a recent flare, he has worked on lifestyle modification (avoiding processed meats and limiting alcohol).  Lab Results  Component Value Date   LABURIC 7.3 03/06/2021     Current Medications:  Current Outpatient Medications on File Prior to Visit  Medication Sig   aspirin 81 MG tablet Take 81 mg by mouth daily.   IRON PO Take 500 mg by mouth every other day.   LOTEMAX 0.5 % GEL    meloxicam (MOBIC) 15 MG tablet Take 1/2 to 1 tablet Daily with Food for Pain & Inflammation (Patient taking differently: Take 1/2 to 1 tablet as needed with Food for Pain & Inflammation)   phentermine (ADIPEX-P) 37.5 MG tablet Take  1/2 to 1 tablet  every Morning  for Dieting & Weight Loss   rosuvastatin (CRESTOR) 20 MG tablet Take 1 tablet Daily for Cholesterol   tadalafil (CIALIS) 20 MG tablet Take  1/2 to 1 tablet  every 2 to 3 days  as needed  for XXXX   Testosterone 20.25 MG/ACT (1.62%) GEL APPLY 2 PUMPS TO THE SKIN DAILY   topiramate (TOPAMAX) 50 MG tablet Take  1/2 to 1 tablet  2 x /day  at Suppertime & Bedtime for Dieting & Weight Loss   vitamin B-12 (CYANOCOBALAMIN) 500 MCG tablet Take 500 mcg by mouth daily.   VITAMIN D PO Take 5,000 Units by mouth 2 (two) times daily.   zinc gluconate 50 MG tablet Take 50 mg by mouth daily.   fluorouracil (EFUDEX) 5 % cream Apply topically 2 (two) times daily. To forehead lesion for 3-4 weeks. (Patient not taking: Reported on 09/06/2021)   No current facility-administered medications on file prior to visit.     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  HENT:  Negative for hearing loss and tinnitus (Ongoing chronic, bilateral, improved with hearing aids).   Eyes:  Negative for blurred vision and double vision.  Respiratory:  Negative for cough, shortness of breath and wheezing.   Cardiovascular:  Negative for chest pain, palpitations, orthopnea, claudication and leg swelling.  Gastrointestinal:  Negative for abdominal pain, blood in stool, constipation, diarrhea, heartburn, melena, nausea and vomiting.  Genitourinary: Negative.   Musculoskeletal:  Positive for joint pain (left shoulder). Negative for myalgias.  Skin:  Negative for rash.  Neurological:  Negative for dizziness, tingling, sensory change, weakness and headaches.  Endo/Heme/Allergies:  Negative for polydipsia.  Psychiatric/Behavioral: Negative.    All other systems  reviewed and are negative.  Physical Exam: BP 128/82    Pulse 91    Temp 97.9 F (36.6 C)    Resp 17    Ht 5\' 8"  (1.727 m)    Wt 209 lb 3.2 oz (94.9 kg)    SpO2 95%    BMI 31.81 kg/m  Wt Readings from Last 3 Encounters:  09/06/21 209 lb 3.2 oz (94.9 kg)  03/06/21 218 lb (98.9 kg)  09/25/20 218 lb (98.9 kg)   General Appearance: Well nourished, in no apparent distress. Eyes: PERRLA, EOMs, conjunctiva no swelling or erythema Sinuses: No Frontal/maxillary tenderness ENT/Mouth: Ext aud canals clear, TMs without erythema, bulging. No erythema, swelling, or exudate on post pharynx.  Tonsils not swollen or erythematous. With bilateral hearing aids.  Neck: Supple, thyroid normal.  Respiratory: Respiratory effort normal, BS equal bilaterally without rales, rhonchi, wheezing or stridor.  Cardio: RRR with no MRGs.  Brisk peripheral pulses without edema.  Abdomen: Soft, + BS.  Non tender, no guarding, rebound, masses. Colostomy bag present to left without ulcers, skin breakdown, purulent discharge/mucus.  Lymphatics: Non tender without lymphadenopathy.  Musculoskeletal: Full ROM, 5/5 strength, Normal gait. Unable to reproduce shoulder pain with exam Skin: Warm, dry without rashes, ecchymosis.  Neuro: Cranial nerves intact. No cerebellar symptoms.  Psych: Awake and oriented X 3, normal affect, Insight and Judgment appropriate.   Magda Bernheim, NP 9:36 AM [facility]

## 2021-09-06 ENCOUNTER — Other Ambulatory Visit: Payer: Self-pay

## 2021-09-06 ENCOUNTER — Ambulatory Visit (INDEPENDENT_AMBULATORY_CARE_PROVIDER_SITE_OTHER): Payer: BC Managed Care – PPO | Admitting: Nurse Practitioner

## 2021-09-06 ENCOUNTER — Encounter: Payer: Self-pay | Admitting: Nurse Practitioner

## 2021-09-06 VITALS — BP 128/82 | HR 91 | Temp 97.9°F | Resp 17 | Ht 68.0 in | Wt 209.2 lb

## 2021-09-06 DIAGNOSIS — G8929 Other chronic pain: Secondary | ICD-10-CM

## 2021-09-06 DIAGNOSIS — E782 Mixed hyperlipidemia: Secondary | ICD-10-CM

## 2021-09-06 DIAGNOSIS — E669 Obesity, unspecified: Secondary | ICD-10-CM | POA: Diagnosis not present

## 2021-09-06 DIAGNOSIS — Z85048 Personal history of other malignant neoplasm of rectum, rectosigmoid junction, and anus: Secondary | ICD-10-CM

## 2021-09-06 DIAGNOSIS — M25512 Pain in left shoulder: Secondary | ICD-10-CM

## 2021-09-06 DIAGNOSIS — R7309 Other abnormal glucose: Secondary | ICD-10-CM | POA: Diagnosis not present

## 2021-09-06 DIAGNOSIS — E291 Testicular hypofunction: Secondary | ICD-10-CM | POA: Diagnosis not present

## 2021-09-06 DIAGNOSIS — I10 Essential (primary) hypertension: Secondary | ICD-10-CM

## 2021-09-06 DIAGNOSIS — Z933 Colostomy status: Secondary | ICD-10-CM

## 2021-09-06 DIAGNOSIS — E559 Vitamin D deficiency, unspecified: Secondary | ICD-10-CM

## 2021-09-06 MED ORDER — MELOXICAM 15 MG PO TABS: ORAL_TABLET | ORAL | 0 refills | Status: AC

## 2021-09-07 LAB — CBC WITH DIFFERENTIAL/PLATELET
Absolute Monocytes: 372 cells/uL (ref 200–950)
Basophils Absolute: 18 cells/uL (ref 0–200)
Basophils Relative: 0.3 %
Eosinophils Absolute: 71 cells/uL (ref 15–500)
Eosinophils Relative: 1.2 %
HCT: 46.9 % (ref 38.5–50.0)
Hemoglobin: 15.7 g/dL (ref 13.2–17.1)
Lymphs Abs: 1664 cells/uL (ref 850–3900)
MCH: 28 pg (ref 27.0–33.0)
MCHC: 33.5 g/dL (ref 32.0–36.0)
MCV: 83.8 fL (ref 80.0–100.0)
MPV: 11.3 fL (ref 7.5–12.5)
Monocytes Relative: 6.3 %
Neutro Abs: 3776 cells/uL (ref 1500–7800)
Neutrophils Relative %: 64 %
Platelets: 196 10*3/uL (ref 140–400)
RBC: 5.6 10*6/uL (ref 4.20–5.80)
RDW: 13.2 % (ref 11.0–15.0)
Total Lymphocyte: 28.2 %
WBC: 5.9 10*3/uL (ref 3.8–10.8)

## 2021-09-07 LAB — LIPID PANEL
Cholesterol: 144 mg/dL (ref <200)
HDL: 53 mg/dL (ref >=40)
LDL Cholesterol (Calc): 75 mg/dL (calc)
Non-HDL Cholesterol (Calc): 91 mg/dL (calc) (ref <130)
Total CHOL/HDL Ratio: 2.7 (calc) (ref <5.0)
Triglycerides: 76 mg/dL (ref <150)

## 2021-09-07 LAB — COMPLETE METABOLIC PANEL WITH GFR
AG Ratio: 2 (calc) (ref 1.0–2.5)
ALT: 26 U/L (ref 9–46)
AST: 15 U/L (ref 10–35)
Albumin: 4.7 g/dL (ref 3.6–5.1)
Alkaline phosphatase (APISO): 49 U/L (ref 35–144)
BUN: 15 mg/dL (ref 7–25)
CO2: 28 mmol/L (ref 20–32)
Calcium: 10.2 mg/dL (ref 8.6–10.3)
Chloride: 103 mmol/L (ref 98–110)
Creat: 1.06 mg/dL (ref 0.70–1.35)
Globulin: 2.4 g/dL (calc) (ref 1.9–3.7)
Glucose, Bld: 88 mg/dL (ref 65–99)
Potassium: 4.6 mmol/L (ref 3.5–5.3)
Sodium: 140 mmol/L (ref 135–146)
Total Bilirubin: 1 mg/dL (ref 0.2–1.2)
Total Protein: 7.1 g/dL (ref 6.1–8.1)
eGFR: 79 mL/min/{1.73_m2} (ref >=60)

## 2021-09-07 LAB — TESTOSTERONE: Testosterone: 113 ng/dL — ABNORMAL LOW (ref 250–827)

## 2021-09-09 NOTE — Progress Notes (Signed)
Please call and advise I recommend the injections versus the nasal testosterone due to age.  Please find out if he is interested in pursuing the injections- testosterone is still low

## 2021-09-10 ENCOUNTER — Other Ambulatory Visit: Payer: Self-pay | Admitting: Nurse Practitioner

## 2021-09-10 DIAGNOSIS — E291 Testicular hypofunction: Secondary | ICD-10-CM

## 2021-09-10 MED ORDER — TESTOSTERONE CYPIONATE 100 MG/ML IM SOLN: 100.0000 mg | INTRAMUSCULAR | 0 refills | Status: DC

## 2021-09-10 NOTE — Progress Notes (Signed)
I sent in the prescription will be doing 100mg  every 2 weeks

## 2021-09-24 ENCOUNTER — Encounter: Payer: Self-pay | Admitting: Internal Medicine

## 2021-11-11 ENCOUNTER — Encounter: Payer: Self-pay | Admitting: Gastroenterology

## 2021-11-12 ENCOUNTER — Other Ambulatory Visit: Payer: Self-pay | Admitting: Adult Health

## 2021-11-12 DIAGNOSIS — E782 Mixed hyperlipidemia: Secondary | ICD-10-CM

## 2022-01-08 ENCOUNTER — Encounter: Payer: BC Managed Care – PPO | Admitting: Internal Medicine

## 2022-01-09 ENCOUNTER — Other Ambulatory Visit: Payer: Self-pay | Admitting: Nurse Practitioner

## 2022-01-09 DIAGNOSIS — E349 Endocrine disorder, unspecified: Secondary | ICD-10-CM

## 2022-01-09 MED ORDER — TESTOSTERONE 20.25 MG/ACT (1.62%) TD GEL: TRANSDERMAL | 0 refills | Status: DC

## 2022-01-09 NOTE — Progress Notes (Signed)
PDMP is reviewed and appropriate  

## 2022-03-05 ENCOUNTER — Encounter: Payer: Self-pay | Admitting: Internal Medicine

## 2022-03-05 NOTE — Progress Notes (Unsigned)
Annual  Screening/Preventative Visit  & Comprehensive Evaluation & Examination  Future Appointments  Date Time Provider Department  03/06/2022 10:00 AM [name], MD [facility]  03/10/2023 10:00 AM [name], MD [facility]            This very nice 61 y.o. MWM presents for a Screening /Preventative Visit & comprehensive evaluation and management of multiple medical co-morbidities.  Patient has been followed for HTN, HLD, Prediabetes and Vitamin D Deficiency.  Patient had resection of a Rectal villous adenoma in 2006 & has a  Colostomy.    Last colonoscopy was in Feb 3018  & in March 2023, Dr Ardis Hughs sent patient a reminder letter recommending scheduling a  colonoscopy. Patient has hx/o Gout quiescent on Allopurinol.        HTN predates circa 2012. Patient's BP has been controlled at home.  Today's BP is                                . Patient denies any cardiac symptoms as chest pain, palpitations, shortness of breath, dizziness or ankle swelling.       Patient's hyperlipidemia is controlled with diet and Rosuvastatin. Patient denies myalgias or other medication SE's. Last lipids were at goal :  Lab Results  Component Value Date   CHOL 144 09/06/2021   HDL 53 09/06/2021   LDLCALC 75 09/06/2021   TRIG 76 09/06/2021   CHOLHDL 2.7 09/06/2021         Patient has moderate obesity (BMI 33+) & consequent  prediabetes (A1c 5.7% /2012).  Patient denies reactive hypoglycemic symptoms, visual blurring, diabetic polys or paresthesias. Last A1c was normal & at goal :   Lab Results  Component Value Date   HGBA1C 5.5 03/06/2021             Patient has hx/o Testosterone  Deficiency ("260" /2012) & is on replacement by gel with improved stamina & sense of well being.  Finally, patient has history of Vitamin D Deficiency ("14" /2009) and last vitamin D was at goal :   Lab Results  Component Value Date   VD25OH 82 03/06/2021       Current Outpatient Medications:    aspirin 81  MG tablet, Take 81 mg by mouth daily., Disp: , Rfl:    IRON PO, Take 500 mg by mouth every other day., Disp: , Rfl:    LOTEMAX 0.5 % GEL, , Disp: , Rfl:    meloxicam (MOBIC) 15 MG tablet, Take 1/2 to 1 tablet Daily with Food for Pain & Inflammation, Disp: 30 tablet, Rfl: 0   phentermine (ADIPEX-P) 37.5 MG tablet, Take  1/2 to 1 tablet  every Morning  for Dieting & Weight Loss, Disp: 90 tablet, Rfl: 1   rosuvastatin (CRESTOR) 20 MG tablet, TAKE ONE TABLET BY MOUTH DAILY FOR CHOLESTEROL, Disp: 90 tablet, Rfl: 3   tadalafil (CIALIS) 20 MG tablet, Take  1/2 to 1 tablet  every 2 to 3 days  as needed  for XXXX, Disp: 30 tablet, Rfl: 1   Testosterone 20.25 MG/ACT (1.62%) GEL, APPLY 2 PUMPS TO THE SKIN DAILY, Disp: 225 g, Rfl: 0   topiramate (TOPAMAX) 50 MG tablet, Take  1/2 to 1 tablet  2 x /day  at Suppertime & Bedtime for Dieting & Weight Loss, Disp: 180 tablet, Rfl: 1   vitamin B-12 (CYANOCOBALAMIN) 500 MCG tablet, Take 500 mcg by mouth daily., Disp: ,  Rfl:    VITAMIN D PO, Take 5,000 Units by mouth 2 (two) times daily., Disp: , Rfl:    zinc gluconate 50 MG tablet, Take 50 mg by mouth daily., Disp: , Rfl:     Allergies  Allergen Reactions   Oxycodone Nausea And Vomiting    Past Medical History:  Diagnosis Date   Arthritis    Hyperlipidemia    Hypertension    Hypogonadism male    Pre-diabetes    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   colostomy    Health Maintenance  Topic Date Due   Pneumococcal Vaccine Never done   Zoster Vaccines- Shingrix (1 of 2) Never done   TETANUS/TDAP  02/16/2018   COVID-19 Vaccine (3 - Pfizer risk series) 03/04/2020   INFLUENZA VACCINE  04/01/2021   COLONOSCOPY 10/21/2021   Hepatitis C Screening  Completed   HIV Screening  Completed   HPV VACCINES  Aged Out    Immunization History  Administered Date(s) Administered   PFIZER Covid-19 Tri-Sucrose Vacc 01/15/2020, 02/05/2020   PPD Test 09/20/2015, 04/16/2017, 05/18/2018   Tdap 02/17/2008    Last Colon  - 10/21/2016 - Dr Ardis Hughs - 5 yr  F/u due 10/2021.  Past Surgical History:  Procedure Laterality Date   ABDOMINOPERINEAL PROCTOCOLECTOMY N/A 07/18/2008   Dr Morton Stall, [facility].  Very distal rectal cancer   colonoscopsy  09/2011   PARASTOMAL HERNIA REPAIR  2010   Dr Lenise Arena,  [facility]   RECONSTRUCT / STABILIZE DISTAL ULNA     WISDOM TOOTH EXTRACTION       Family History  Problem Relation Age of Onset   COPD Mother    Alcohol abuse Father    Cirrhosis Father    COPD Sister    Alcohol abuse Brother    Diabetes Maternal Grandmother    Colon cancer Neg Hx     Social History   Socioeconomic History   Marital status: Married    Spouse name: Not on file   Number of children: Not on file  Occupational History   Not on file  Tobacco Use   Smoking status: Never   Smokeless tobacco: Never  Substance and Sexual Activity   Alcohol use: Yes    Alcohol/week: 4.0 standard drinks    Types: 4 Glasses of wine per week    Comment: 4   Drug use: No   Sexual activity: Not on file    ROS Constitutional: Denies fever, chills, weight loss/gain, headaches, insomnia,  night sweats or change in appetite. Does c/o fatigue. Eyes: Denies redness, blurred vision, diplopia, discharge, itchy or watery eyes.  ENT: Denies discharge, congestion, post nasal drip, epistaxis, sore throat, earache, hearing loss, dental pain, Tinnitus, Vertigo, Sinus pain or snoring.  Cardio: Denies chest pain, palpitations, irregular heartbeat, syncope, dyspnea, diaphoresis, orthopnea, PND, claudication or edema Respiratory: denies cough, dyspnea, DOE, pleurisy, hoarseness, laryngitis or wheezing.  Gastrointestinal: Denies dysphagia, heartburn, reflux, water brash, pain, cramps, nausea, vomiting, bloating, diarrhea, constipation, hematemesis, melena, hematochezia, jaundice or hemorrhoids Genitourinary: Denies dysuria, frequency, urgency, nocturia, hesitancy, discharge, hematuria or flank pain Musculoskeletal: Denies arthralgia,  myalgia, stiffness, Jt. Swelling, pain, limp or strain/sprain. Denies Falls. Skin: Denies puritis, rash, hives, warts, acne, eczema or change in skin lesion Neuro: No weakness, tremor, incoordination, spasms, paresthesia or pain Psychiatric: Denies confusion, memory loss or sensory loss. Denies Depression. Endocrine: Denies change in weight, skin, hair change, nocturia, and paresthesia, diabetic polys, visual blurring or hyper / hypo glycemic episodes.  Heme/Lymph: No excessive bleeding, bruising or enlarged  lymph nodes.   Physical Exam  There were no vitals taken for this visit.  General Appearance: Over nourished and well groomed and in no apparent distress.  Eyes: PERRLA, EOMs, conjunctiva no swelling or erythema, normal fundi and vessels. Sinuses: No frontal/maxillary tenderness ENT/Mouth: EACs patent / TMs  nl. Nares clear without erythema, swelling, mucoid exudates. Oral hygiene is good. No erythema, swelling, or exudate. Tongue normal, non-obstructing. Tonsils not swollen or erythematous. Hearing normal.  Neck: Supple, thyroid not palpable. No bruits, nodes or JVD. Respiratory: Respiratory effort normal.  BS equal and clear bilateral without rales, rhonci, wheezing or stridor. Cardio: Heart sounds are normal with regular rate and rhythm and no murmurs, rubs or gallops. Peripheral pulses are normal and equal bilaterally without edema. No aortic or femoral bruits. Chest: symmetric with normal excursions and percussion.  Abdomen: Soft, with Nl bowel sounds.  LLQ colostomy.  Moderate laxity of the abdominal musculature with rectus diastasis.  Non tender, no guarding, rebound, hernias, masses, or organomegaly.  Lymphatics: Non tender without lymphadenopathy.  Musculoskeletal: Full ROM all peripheral extremities, joint stability, 5/5 strength, and normal gait. Skin: Warm and dry without rashes, lesions, cyanosis, clubbing or  ecchymosis.  Neuro: Cranial nerves intact, reflexes equal  bilaterally. Normal muscle tone, no cerebellar symptoms. Sensation intact.  Pysch: Alert and oriented X 3 with normal affect, insight and judgment appropriate.   Assessment and Plan  1. Annual Preventative/Screening Exam   2. Labile hypertension  - EKG 12-Lead - [place], RETROPERITNL ABD,  LTD - Urinalysis, Routine w reflex microscopic - Microalbumin / creatinine urine ratio - CBC with Differential/Platelet - COMPLETE METABOLIC PANEL WITH GFR - Magnesium - TSH  3. Hyperlipidemia, mixed  - EKG 12-Lead - [place], RETROPERITNL ABD,  LTD - Lipid panel - TSH  4. Abnormal glucose  - EKG 12-Lead - [place], RETROPERITNL ABD,  LTD - Hemoglobin A1c - Insulin, random  5. Vitamin D deficiency  - VITAMIN D 25 Hydroxy 6. Testosterone deficiency  - Testosterone  7. Idiopathic gout  - Uric acid  8. Screening-pulmonary TB  - TB Skin Test  9. Colostomy  ([place])   10. Screening for colorectal cancer  - POC Hemoccult Bld/Stl  11. Prostate cancer screening  - PSA  12. Screening for ischemic heart disease  - EKG 12-Lead  13. Screening for AAA (aortic abdominal aneurysm)  - [place], RETROPERITNL ABD,  LTD  14. Fatigue, unspecified type  - Urinalysis, Routine w reflex microscopic - Microalbumin / creatinine urine ratio - Iron, Total/Total Iron Binding Cap - Vitamin B12 - Testosterone - CBC with Differential/Platelet - TSH  15. Medication management  - CBC with Differential/Platelet - COMPLETE METABOLIC PANEL WITH GFR - Magnesium - Lipid panel - TSH - Hemoglobin A1c - Insulin, random - VITAMIN D 25 Hydroxy          Patient was counseled in prudent diet, weight control to achieve/maintain BMI less than 25, BP monitoring, regular exercise and medications as discussed.  Discussed med effects and SE's. Routine screening labs and tests as requested with regular follow-up as recommended. Over 40 minutes of exam, counseling, chart review and high complex critical decision making  was performed   Kirtland Bouchard, MD

## 2022-03-05 NOTE — Patient Instructions (Signed)

## 2022-03-06 ENCOUNTER — Encounter: Payer: Self-pay | Admitting: Internal Medicine

## 2022-03-06 ENCOUNTER — Ambulatory Visit: Payer: BC Managed Care – PPO | Admitting: Internal Medicine

## 2022-03-06 VITALS — BP 151/101 | HR 79 | Temp 97.1°F | Resp 16 | Ht 68.0 in | Wt 205.4 lb

## 2022-03-06 DIAGNOSIS — E349 Endocrine disorder, unspecified: Secondary | ICD-10-CM

## 2022-03-06 DIAGNOSIS — Z13220 Encounter for screening for lipoid disorders: Secondary | ICD-10-CM | POA: Diagnosis not present

## 2022-03-06 DIAGNOSIS — Z111 Encounter for screening for respiratory tuberculosis: Secondary | ICD-10-CM

## 2022-03-06 DIAGNOSIS — Z131 Encounter for screening for diabetes mellitus: Secondary | ICD-10-CM

## 2022-03-06 DIAGNOSIS — N401 Enlarged prostate with lower urinary tract symptoms: Secondary | ICD-10-CM | POA: Diagnosis not present

## 2022-03-06 DIAGNOSIS — Z0001 Encounter for general adult medical examination with abnormal findings: Secondary | ICD-10-CM

## 2022-03-06 DIAGNOSIS — R5383 Other fatigue: Secondary | ICD-10-CM

## 2022-03-06 DIAGNOSIS — Z1389 Encounter for screening for other disorder: Secondary | ICD-10-CM

## 2022-03-06 DIAGNOSIS — Z1329 Encounter for screening for other suspected endocrine disorder: Secondary | ICD-10-CM

## 2022-03-06 DIAGNOSIS — R350 Frequency of micturition: Secondary | ICD-10-CM | POA: Diagnosis not present

## 2022-03-06 DIAGNOSIS — Z933 Colostomy status: Secondary | ICD-10-CM

## 2022-03-06 DIAGNOSIS — Z1211 Encounter for screening for malignant neoplasm of colon: Secondary | ICD-10-CM

## 2022-03-06 DIAGNOSIS — Z130 Encounter for screening for diseases of the blood and blood-forming organs and certain disorders involving the immune mechanism: Secondary | ICD-10-CM | POA: Diagnosis not present

## 2022-03-06 DIAGNOSIS — Z125 Encounter for screening for malignant neoplasm of prostate: Secondary | ICD-10-CM | POA: Diagnosis not present

## 2022-03-06 DIAGNOSIS — R7309 Other abnormal glucose: Secondary | ICD-10-CM

## 2022-03-06 DIAGNOSIS — N138 Other obstructive and reflux uropathy: Secondary | ICD-10-CM

## 2022-03-06 DIAGNOSIS — R0989 Other specified symptoms and signs involving the circulatory and respiratory systems: Secondary | ICD-10-CM

## 2022-03-06 DIAGNOSIS — Z136 Encounter for screening for cardiovascular disorders: Secondary | ICD-10-CM | POA: Diagnosis not present

## 2022-03-06 DIAGNOSIS — E559 Vitamin D deficiency, unspecified: Secondary | ICD-10-CM | POA: Diagnosis not present

## 2022-03-06 DIAGNOSIS — E782 Mixed hyperlipidemia: Secondary | ICD-10-CM

## 2022-03-06 DIAGNOSIS — I700 Atherosclerosis of aorta: Secondary | ICD-10-CM

## 2022-03-06 DIAGNOSIS — Z79899 Other long term (current) drug therapy: Secondary | ICD-10-CM | POA: Diagnosis not present

## 2022-03-06 DIAGNOSIS — M1000 Idiopathic gout, unspecified site: Secondary | ICD-10-CM

## 2022-03-06 DIAGNOSIS — Z0000 Encounter for general adult medical examination without abnormal findings: Secondary | ICD-10-CM | POA: Diagnosis not present

## 2022-03-06 MED ORDER — HYOSCYAMINE SULFATE 0.125 MG PO TABS: ORAL_TABLET | ORAL | 0 refills | Status: DC

## 2022-03-07 LAB — URINALYSIS, ROUTINE W REFLEX MICROSCOPIC
Bilirubin Urine: NEGATIVE
Glucose, UA: NEGATIVE
Hgb urine dipstick: NEGATIVE
Ketones, ur: NEGATIVE
Leukocytes,Ua: NEGATIVE
Nitrite: NEGATIVE
Protein, ur: NEGATIVE
Specific Gravity, Urine: 1.02 (ref 1.001–1.035)
pH: 5.5 (ref 5.0–8.0)

## 2022-03-07 LAB — COMPLETE METABOLIC PANEL WITH GFR
AG Ratio: 2.1 (calc) (ref 1.0–2.5)
ALT: 19 U/L (ref 9–46)
AST: 15 U/L (ref 10–35)
Albumin: 4.4 g/dL (ref 3.6–5.1)
Alkaline phosphatase (APISO): 47 U/L (ref 35–144)
BUN: 16 mg/dL (ref 7–25)
CO2: 28 mmol/L (ref 20–32)
Calcium: 9.7 mg/dL (ref 8.6–10.3)
Chloride: 105 mmol/L (ref 98–110)
Creat: 1.09 mg/dL (ref 0.70–1.35)
Globulin: 2.1 g/dL (calc) (ref 1.9–3.7)
Glucose, Bld: 85 mg/dL (ref 65–99)
Potassium: 4.3 mmol/L (ref 3.5–5.3)
Sodium: 140 mmol/L (ref 135–146)
Total Bilirubin: 0.7 mg/dL (ref 0.2–1.2)
Total Protein: 6.5 g/dL (ref 6.1–8.1)
eGFR: 77 mL/min/{1.73_m2} (ref >=60)

## 2022-03-07 LAB — CBC WITH DIFFERENTIAL/PLATELET
Absolute Monocytes: 387 cells/uL (ref 200–950)
Basophils Absolute: 21 cells/uL (ref 0–200)
Basophils Relative: 0.4 %
Eosinophils Absolute: 80 cells/uL (ref 15–500)
Eosinophils Relative: 1.5 %
HCT: 46.2 % (ref 38.5–50.0)
Hemoglobin: 15.2 g/dL (ref 13.2–17.1)
Lymphs Abs: 1516 cells/uL (ref 850–3900)
MCH: 27.5 pg (ref 27.0–33.0)
MCHC: 32.9 g/dL (ref 32.0–36.0)
MCV: 83.7 fL (ref 80.0–100.0)
MPV: 11.6 fL (ref 7.5–12.5)
Monocytes Relative: 7.3 %
Neutro Abs: 3297 cells/uL (ref 1500–7800)
Neutrophils Relative %: 62.2 %
Platelets: 167 10*3/uL (ref 140–400)
RBC: 5.52 10*6/uL (ref 4.20–5.80)
RDW: 13.3 % (ref 11.0–15.0)
Total Lymphocyte: 28.6 %
WBC: 5.3 10*3/uL (ref 3.8–10.8)

## 2022-03-07 LAB — HEMOGLOBIN A1C
Hgb A1c MFr Bld: 5.5 % of total Hgb (ref <5.7)
Mean Plasma Glucose: 111 mg/dL
eAG (mmol/L): 6.2 mmol/L

## 2022-03-07 LAB — IRON, TOTAL/TOTAL IRON BINDING CAP
%SAT: 32 % (calc) (ref 20–48)
Iron: 88 ug/dL (ref 50–180)
TIBC: 279 mcg/dL (calc) (ref 250–425)

## 2022-03-07 LAB — LIPID PANEL
Cholesterol: 159 mg/dL (ref <200)
HDL: 56 mg/dL (ref >=40)
LDL Cholesterol (Calc): 78 mg/dL (calc)
Non-HDL Cholesterol (Calc): 103 mg/dL (calc) (ref <130)
Total CHOL/HDL Ratio: 2.8 (calc) (ref <5.0)
Triglycerides: 155 mg/dL — ABNORMAL HIGH (ref <150)

## 2022-03-07 LAB — MICROALBUMIN / CREATININE URINE RATIO
Creatinine, Urine: 177 mg/dL (ref 20–320)
Microalb Creat Ratio: 1 mcg/mg creat (ref <30)
Microalb, Ur: 0.2 mg/dL

## 2022-03-07 LAB — MAGNESIUM: Magnesium: 2.3 mg/dL (ref 1.5–2.5)

## 2022-03-07 LAB — VITAMIN D 25 HYDROXY (VIT D DEFICIENCY, FRACTURES): Vit D, 25-Hydroxy: 83 ng/mL (ref 30–100)

## 2022-03-07 LAB — TSH: TSH: 1.93 mIU/L (ref 0.40–4.50)

## 2022-03-07 LAB — PSA: PSA: 0.25 ng/mL (ref <=4.00)

## 2022-03-07 LAB — URIC ACID: Uric Acid, Serum: 6.5 mg/dL (ref 4.0–8.0)

## 2022-03-07 LAB — INSULIN, RANDOM: Insulin: 40.3 u[IU]/mL — ABNORMAL HIGH

## 2022-03-07 LAB — TESTOSTERONE: Testosterone: 120 ng/dL — ABNORMAL LOW (ref 250–827)

## 2022-03-07 LAB — VITAMIN B12: Vitamin B-12: 632 pg/mL (ref 200–1100)

## 2022-03-09 NOTE — Progress Notes (Signed)
<><><><><><><><><><><><><><><><><><><><><><><><><><><><><><><><><> <><><><><><><><><><><><><><><><><><><><><><><><><><><><><><><><><> -   Test results slightly outside the reference range are not unusual. If there is anything important, I will review this with you,  otherwise it is considered normal test values.  If you have further questions,  please do not hesitate to contact me at the office or via My Chart.  <><><><><><><><><><><><><><><><><><><><><><><><><><><><><><><><><> <><><><><><><><><><><><><><><><><><><><><><><><><><><><><><><><><>  -  Testosterone level remains low - please continue Testosterone gel   - Taking Zinc 50 mg tab daily may help raise testosterone levels naturally    - Also exercise 30 + minutes  2 x /day helps raise Testosterone levels & lastly   - Losing weight helps raise Testosterone levels since it is well known that   excess fat tissue in a male metabolizes                                            Testosterone into Estrogens  ( male hormones )  !  <><><><><><><><><><><><><><><><><><><><><><><><><><><><><><><><><>  - Total Chol = 159   & LDL Chol = 78   - both  Excellent   - Very low risk for Heart Attack  / Stroke <><><><><><><><><><><><><><><><><><><><><><><><><><><><><><><><><>  -  Iron  & Vitamin B12 levels - Both Normal  <><><><><><><><><><><><><><><><><><><><><><><><><><><><><><><><><>  - PSA - very low  - Great  <><><><><><><><><><><><><><><><><><><><><><><><><><><><><><><><><>  - Uric Acid  / Gout test is Normal & OK - Please continue Allopurinol  <><><><><><><><><><><><><><><><><><><><><><><><><><><><><><><><><>  - A1c - Normal - No Diabetes  - Great !  <><><><><><><><><><><><><><><><><><><><><><><><><><><><><><><><><>  - Vitamin D = 83 - Excellent - Please keep dose same  <><><><><><><><><><><><><><><><><><><><><><><><><><><><><><><><><>  - All Else - CBC - Kidneys - Electrolytes - Liver - Magnesium & Thyroid    - all  Normal /  OK <><><><><><><><><><><><><><><><><><><><><><><><><><><><><><><><><>  -  Keep up the [place] work !   <><><><><><><><><><><><><><><><><><><><><><><><><><><><><><><><><>

## 2022-06-09 NOTE — Progress Notes (Deleted)
FOLLOW UP  Assessment and Plan:   Hypertension Recently well controlled off of medications  Monitor blood pressure at home; patient to call if consistently greater than 130/80 Continue DASH diet.   Reminder to go to the ER if any CP, SOB, nausea, dizziness, severe HA, changes vision/speech, left arm numbness and tingling and jaw pain. Check CBC  Cholesterol Currently at goal; continue rosuvastatin 20 mg daily  Continue low cholesterol diet and exercise.  Check lipid panel.  Check CMP  Other abnormal glucose Last several A1Cs at goal Continue diet and exercise.  Perform daily foot/skin check, notify office of any concerning changes.  Defer A1C; check CMP/GFR  Obesity with co morbidities Long discussion about weight loss, diet, and exercise Recommended diet heavy in fruits and veggies and low in animal meats, cheeses, and dairy products, appropriate calorie intake Continue Phentermine and Topamax Will follow up in 3 months  Vitamin D Def At goal at last visit; continue supplementation to maintain goal of 70-100 Defer Vit D level  Hypogonadism - continue replacement therapy, zinc supplement, reviewed weight loss, encouraged HIIT and resistance exercises, check testosterone level.  If testosterone level remains low will consider changing to nasal testosterone or injections  Colostomy (HCC)/history of rectal cancer Doing well without complications.  Herniation on one side.   Chronic Left Shoulder Pain Will take Meloxicam daily x 2 weeks, if no alleviation of pain will get xray and possibly MRI   Continue diet and meds as discussed. Further disposition pending results of labs. Discussed med's effects and SE's.   Over 30 minutes of exam, counseling, chart review, and critical decision making was performed.   Future Appointments  Date Time Provider [place]  06/10/2022  9:30 AM Alycia Rossetti, NP [facility] None  03/10/2023 10:00 AM [name], MD  [facility] None    ----------------------------------------------------------------------------------------------------------------------  HPI 61 y.o. male  presents for 3 month follow up on hypertension, cholesterol, prediabetes, hypogonadism on testosterone supplementations, obesity and vitamin D deficiency.  He has hx of rectal cancer with resection and colostomy in 2009 - he is doing well since.   He is having Left shoulder pain which has been occurring x 6 months.  He describes it as a sharp pain in the shoulder joint which is worse when lifting something overhead.   BMI is There is no height or weight on file to calculate BMI., he has been working on diet and exercise.  He is working on diet, using Phentermine and Topamax. He is walking daily.  Wt Readings from Last 3 Encounters:  03/06/22 205 lb 6.4 oz (93.2 kg)  09/06/21 209 lb 3.2 oz (94.9 kg)  03/06/21 218 lb (98.9 kg)   He has not been checking his BP at home, doing well off of medications for several years, today their BP is   BP Readings from Last 3 Encounters:  03/06/22 (!) 151/101  09/06/21 128/82  03/06/21 117/73     He does workout - he walks 30 mins daily.  He denies chest pain, shortness of breath, dizziness.   He is on cholesterol medication - rosuvastatin 20 mg daily - and denies myalgias. His LDL cholesterol is at goal. The cholesterol last visit was:   Lab Results  Component Value Date   CHOL 159 03/06/2022   HDL 56 03/06/2022   LDLCALC 78 03/06/2022   TRIG 155 (H) 03/06/2022   CHOLHDL 2.8 03/06/2022    He has been working on diet and exercise for glucose management, and  denies increased appetite, nausea, paresthesia of the feet, polydipsia, polyuria, visual disturbances and vomiting. Last A1C in the office was:  Lab Results  Component Value Date   HGBA1C 5.5 03/06/2022   Patient is on Vitamin D supplement and at goal at the last check:    Lab Results  Component Value Date   VD25OH 83 03/06/2022      He has a history of testosterone deficiency and is on testosterone replacement, using topical testosterone. He states that the testosterone helps with his energy, libido, muscle mass. Lab Results  Component Value Date   TESTOSTERONE 120 (L) 03/06/2022   Patient has dx of gout but not on allopurinol in last few years and does not report a recent flare, he has worked on lifestyle modification (avoiding processed meats and limiting alcohol).  Lab Results  Component Value Date   LABURIC 6.5 03/06/2022     Current Medications:  Current Outpatient Medications on File Prior to Visit  Medication Sig   aspirin 81 MG tablet Take 81 mg by mouth daily.   hyoscyamine (LEVSIN) 0.125 MG tablet Take 1 to 2 tablets every 4 to 6 hours as needed for Nausea, Cramping or Bloating   IRON PO Take 500 mg by mouth every other day.   LOTEMAX 0.5 % GEL    meloxicam (MOBIC) 15 MG tablet Take 1/2 to 1 tablet Daily with Food for Pain & Inflammation   phentermine (ADIPEX-P) 37.5 MG tablet Take  1/2 to 1 tablet  every Morning  for Dieting & Weight Loss   rosuvastatin (CRESTOR) 20 MG tablet TAKE ONE TABLET BY MOUTH DAILY FOR CHOLESTEROL   tadalafil (CIALIS) 20 MG tablet Take  1/2 to 1 tablet  every 2 to 3 days  as needed  for XXXX   Testosterone 20.25 MG/ACT (1.62%) GEL APPLY 2 PUMPS TO THE SKIN DAILY   topiramate (TOPAMAX) 50 MG tablet Take  1/2 to 1 tablet  2 x /day  at Suppertime & Bedtime for Dieting & Weight Loss   vitamin B-12 (CYANOCOBALAMIN) 500 MCG tablet Take 500 mcg by mouth daily.   VITAMIN D PO Take 5,000 Units by mouth 2 (two) times daily.   zinc gluconate 50 MG tablet Take 50 mg by mouth daily.   No current facility-administered medications on file prior to visit.     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  HENT:  Negative for hearing loss and tinnitus (Ongoing chronic, bilateral, improved with hearing aids).   Eyes:  Negative for blurred vision and double vision.  Respiratory:  Negative for cough, shortness of breath and wheezing.   Cardiovascular:  Negative for chest pain, palpitations, orthopnea, claudication and leg swelling.  Gastrointestinal:  Negative for abdominal pain, blood in stool, constipation, diarrhea, heartburn, melena, nausea and vomiting.  Genitourinary: Negative.   Musculoskeletal:  Positive for joint pain (left shoulder). Negative for myalgias.  Skin:  Negative for rash.  Neurological:  Negative for dizziness, tingling, sensory change, weakness and headaches.  Endo/Heme/Allergies:  Negative for polydipsia.  Psychiatric/Behavioral: Negative.  All other systems reviewed and are negative.   Physical Exam: There were no vitals taken for this visit. Wt Readings from Last 3 Encounters:  03/06/22 205 lb 6.4 oz (93.2 kg)  09/06/21 209 lb 3.2 oz (94.9 kg)  03/06/21 218 lb (98.9 kg)   General Appearance: Well nourished, in no apparent distress. Eyes: PERRLA, EOMs, conjunctiva no swelling or erythema Sinuses: No Frontal/maxillary tenderness ENT/Mouth: Ext aud canals clear, TMs without erythema, bulging. No erythema, swelling, or exudate on post pharynx.  Tonsils not swollen or erythematous. With bilateral hearing aids.  Neck: Supple, thyroid normal.  Respiratory: Respiratory effort normal, BS equal bilaterally without rales, rhonchi, wheezing or stridor.  Cardio: RRR with no MRGs. Brisk peripheral pulses without edema.  Abdomen: Soft, + BS.  Non tender, no guarding, rebound, masses. Colostomy bag present to left without ulcers, skin breakdown,  purulent discharge/mucus.  Lymphatics: Non tender without lymphadenopathy.  Musculoskeletal: Full ROM, 5/5 strength, Normal gait. Unable to reproduce shoulder pain with exam Skin: Warm, dry without rashes, ecchymosis.  Neuro: Cranial nerves intact. No cerebellar symptoms.  Psych: Awake and oriented X 3, normal affect, Insight and Judgment appropriate.   Alycia Rossetti, NP 12:27 PM [facility]

## 2022-06-10 ENCOUNTER — Ambulatory Visit: Payer: BC Managed Care – PPO | Admitting: Nurse Practitioner

## 2022-06-20 NOTE — Progress Notes (Unsigned)
FOLLOW UP  Assessment and Plan:   Hypertension Recently well controlled off of medications  Monitor blood pressure at home; patient to call if consistently greater than 130/80 Continue DASH diet.   Reminder to go to the ER if any CP, SOB, nausea, dizziness, severe HA, changes vision/speech, left arm numbness and tingling and jaw pain. Check CBC  Cholesterol Currently at goal; continue rosuvastatin 20 mg daily  Continue low cholesterol diet and exercise.  Check lipid panel.  Check CMP  Other abnormal glucose Last several A1Cs at goal Continue diet and exercise.  Perform daily foot/skin check, notify office of any concerning changes.  Defer A1C; check CMP/GFR  Obesity with co morbidities Long discussion about weight loss, diet, and exercise Recommended diet heavy in fruits and veggies and low in animal meats, cheeses, and dairy products, appropriate calorie intake Will follow up in 3 months  Vitamin D Def At goal at last visit; continue supplementation to maintain goal of 70-100 Defer Vit D level  Hypogonadism - continue replacement therapy, zinc supplement, reviewed weight loss, encouraged HIIT and resistance exercises, check testosterone level.    Colostomy (HCC)/history of rectal cancer Doing well without complications.  Herniation on one side.   Chronic Left Shoulder Pain Currently taking Meloxicam Prn and Voltaren gel   Continue diet and meds as discussed. Further disposition pending results of labs. Discussed med's effects and SE's.   Over 30 minutes of exam, counseling, chart review, and critical decision making was performed.   Future Appointments  Date Time Provider [employer]  03/10/2023 10:00 AM [name], MD [facility] None    ----------------------------------------------------------------------------------------------------------------------  HPI 61 y.o. male  presents for 3 month follow up on hypertension, cholesterol, prediabetes,  hypogonadism on testosterone supplementations, obesity and vitamin D deficiency.   He has hx of rectal cancer with resection and colostomy in 2009 - he is doing well since.   He is having Left shoulder pain which has been occurring x 6 months.  He describes it as a sharp pain in the shoulder joint which is worse when lifting something overhead. He is currently using Voltaren gel and it is helping.   BMI is Body mass index is 31.38 kg/m., he has been working on diet and exercise.  He is walking daily. Wt Readings from Last 3 Encounters:  06/23/22 206 lb 6.4 oz (93.6 kg)  03/06/22 205 lb 6.4 oz (93.2 kg)  09/06/21 209 lb 3.2 oz (94.9 kg)   He has not been checking his BP at home, doing well off of medications for several years, today their BP is BP: 138/80 BP Readings from Last 3 Encounters:  06/23/22 138/80  03/06/22 (!) 151/101  09/06/21 128/82     He does workout - he walks 30 mins daily.  He denies chest pain, shortness of breath, dizziness.   He is on cholesterol medication - rosuvastatin 20 mg daily - and denies myalgias. His LDL cholesterol is at goal. The cholesterol last visit was:   Lab Results  Component Value Date   CHOL 159 03/06/2022   HDL 56 03/06/2022   LDLCALC 78 03/06/2022   TRIG 155 (H) 03/06/2022   CHOLHDL 2.8 03/06/2022    He has been working on diet and exercise for glucose management, and denies increased appetite, nausea, paresthesia of the feet, polydipsia, polyuria, visual disturbances and vomiting. Last A1C in the office was:  Lab Results  Component Value Date   HGBA1C 5.5 03/06/2022   Patient is on Vitamin D supplement  and at goal at the last check:    Lab Results  Component Value Date   VD25OH 83 03/06/2022     He has a history of testosterone deficiency and is on testosterone replacement, using topical testosterone. He states that the testosterone helps with his energy, libido, muscle mass. Lab Results  Component Value Date   TESTOSTERONE 120 (L)  03/06/2022   Patient has dx of gout but not on allopurinol in last few years and does not report a recent flare, he has worked on lifestyle modification (avoiding processed meats and limiting alcohol).  Lab Results  Component Value Date   LABURIC 6.5 03/06/2022     Current Medications:  Current Outpatient Medications on File Prior to Visit  Medication Sig   aspirin 81 MG tablet Take 81 mg by mouth daily.   IRON PO Take 500 mg by mouth every other day.   LOTEMAX 0.5 % GEL    meloxicam (MOBIC) 15 MG tablet Take 1/2 to 1 tablet Daily with Food for Pain & Inflammation   rosuvastatin (CRESTOR) 20 MG tablet TAKE ONE TABLET BY MOUTH DAILY FOR CHOLESTEROL   tadalafil (CIALIS) 20 MG tablet Take  1/2 to 1 tablet  every 2 to 3 days  as needed  for XXXX   Testosterone 20.25 MG/ACT (1.62%) GEL APPLY 2 PUMPS TO THE SKIN DAILY   vitamin B-12 (CYANOCOBALAMIN) 500 MCG tablet Take 500 mcg by mouth daily.   VITAMIN D PO Take 5,000 Units by mouth 2 (two) times daily.   zinc gluconate 50 MG tablet Take 50 mg by mouth daily.   hyoscyamine (LEVSIN) 0.125 MG tablet Take 1 to 2 tablets every 4 to 6 hours as needed for Nausea, Cramping or Bloating (Patient not taking: Reported on 06/23/2022)   phentermine (ADIPEX-P) 37.5 MG tablet Take  1/2 to 1 tablet  every Morning  for Dieting & Weight Loss (Patient not taking: Reported on 06/23/2022)   topiramate (TOPAMAX) 50 MG tablet Take  1/2 to 1 tablet  2 x /day  at Suppertime & Bedtime for Dieting & Weight Loss (Patient not taking: Reported on 06/23/2022)   No current facility-administered medications on file prior to visit.     Allergies:  Allergies  Allergen Reactions   Oxycodone Nausea And Vomiting     Medical History:  Past Medical History:  Diagnosis Date   Arthritis    Hyperlipidemia    Hypertension    Hypogonadism male    Pre-diabetes    Rectal adenocarcinoma ([place]) 2009   colostomy   Family history- Reviewed and unchanged Social history-  Reviewed and unchanged   Review of Systems:  Review of Systems  Constitutional:  Negative for malaise/fatigue and weight loss.  HENT:  Negative for hearing loss and tinnitus (Ongoing chronic, bilateral, improved with hearing aids).   Eyes:  Negative for blurred vision and double vision.  Respiratory:  Negative for cough, shortness of breath and wheezing.   Cardiovascular:  Negative for chest pain, palpitations, orthopnea, claudication and leg swelling.  Gastrointestinal:  Negative for abdominal pain, blood in stool, constipation, diarrhea, heartburn, melena, nausea and vomiting.  Genitourinary: Negative.   Musculoskeletal:  Positive for joint pain (left shoulder). Negative for myalgias.  Skin:  Negative for rash.  Neurological:  Negative for dizziness, tingling, sensory change, weakness and headaches.  Endo/Heme/Allergies:  Negative for polydipsia.  Psychiatric/Behavioral: Negative.    All other systems reviewed and are negative.   Physical Exam: BP 138/80   Pulse 73  Temp (!) 97.2 F (36.2 C)   Wt 206 lb 6.4 oz (93.6 kg)   SpO2 97%   BMI 31.38 kg/m  Wt Readings from Last 3 Encounters:  06/23/22 206 lb 6.4 oz (93.6 kg)  03/06/22 205 lb 6.4 oz (93.2 kg)  09/06/21 209 lb 3.2 oz (94.9 kg)   General Appearance: Well nourished, in no apparent distress. Eyes: PERRLA, EOMs, conjunctiva no swelling or erythema Sinuses: No Frontal/maxillary tenderness ENT/Mouth: Ext aud canals clear, TMs without erythema, bulging. No erythema, swelling, or exudate on post pharynx.  Tonsils not swollen or erythematous. With bilateral hearing aids.  Neck: Supple, thyroid normal.  Respiratory: Respiratory effort normal, BS equal bilaterally without rales, rhonchi, wheezing or stridor.  Cardio: RRR with no MRGs. Brisk peripheral pulses without edema.  Abdomen: Soft, + BS.  Non tender, no guarding, rebound, masses. Colostomy bag present to left without ulcers, skin breakdown, purulent discharge/mucus.   Lymphatics: Non tender without lymphadenopathy.  Musculoskeletal: Full ROM, 5/5 strength, Normal gait. Unable to reproduce shoulder pain with exam Skin: Warm, dry without rashes, ecchymosis.  Neuro: Cranial nerves intact. No cerebellar symptoms.  Psych: Awake and oriented X 3, normal affect, Insight and Judgment appropriate.   Alycia Rossetti, NP 9:42 AM [facility]

## 2022-06-23 ENCOUNTER — Encounter: Payer: Self-pay | Admitting: Nurse Practitioner

## 2022-06-23 ENCOUNTER — Ambulatory Visit: Payer: BC Managed Care – PPO | Admitting: Nurse Practitioner

## 2022-06-23 VITALS — BP 138/80 | HR 73 | Temp 97.2°F | Wt 206.4 lb

## 2022-06-23 DIAGNOSIS — R0989 Other specified symptoms and signs involving the circulatory and respiratory systems: Secondary | ICD-10-CM

## 2022-06-23 DIAGNOSIS — E782 Mixed hyperlipidemia: Secondary | ICD-10-CM

## 2022-06-23 DIAGNOSIS — Z933 Colostomy status: Secondary | ICD-10-CM

## 2022-06-23 DIAGNOSIS — Z79899 Other long term (current) drug therapy: Secondary | ICD-10-CM

## 2022-06-23 DIAGNOSIS — E291 Testicular hypofunction: Secondary | ICD-10-CM

## 2022-06-23 DIAGNOSIS — M25512 Pain in left shoulder: Secondary | ICD-10-CM

## 2022-06-23 DIAGNOSIS — R7309 Other abnormal glucose: Secondary | ICD-10-CM | POA: Diagnosis not present

## 2022-06-23 DIAGNOSIS — E559 Vitamin D deficiency, unspecified: Secondary | ICD-10-CM | POA: Diagnosis not present

## 2022-06-23 DIAGNOSIS — Z85048 Personal history of other malignant neoplasm of rectum, rectosigmoid junction, and anus: Secondary | ICD-10-CM

## 2022-06-23 DIAGNOSIS — G8929 Other chronic pain: Secondary | ICD-10-CM

## 2022-06-23 NOTE — Patient Instructions (Signed)

## 2022-06-24 LAB — CBC WITH DIFFERENTIAL/PLATELET
Absolute Monocytes: 388 cells/uL (ref 200–950)
Basophils Absolute: 17 cells/uL (ref 0–200)
Basophils Relative: 0.3 %
Eosinophils Absolute: 68 cells/uL (ref 15–500)
Eosinophils Relative: 1.2 %
HCT: 47.3 % (ref 38.5–50.0)
Hemoglobin: 16 g/dL (ref 13.2–17.1)
Lymphs Abs: 1642 cells/uL (ref 850–3900)
MCH: 28.8 pg (ref 27.0–33.0)
MCHC: 33.8 g/dL (ref 32.0–36.0)
MCV: 85.1 fL (ref 80.0–100.0)
MPV: 11.7 fL (ref 7.5–12.5)
Monocytes Relative: 6.8 %
Neutro Abs: 3585 cells/uL (ref 1500–7800)
Neutrophils Relative %: 62.9 %
Platelets: 174 10*3/uL (ref 140–400)
RBC: 5.56 10*6/uL (ref 4.20–5.80)
RDW: 13.4 % (ref 11.0–15.0)
Total Lymphocyte: 28.8 %
WBC: 5.7 10*3/uL (ref 3.8–10.8)

## 2022-06-24 LAB — COMPLETE METABOLIC PANEL WITH GFR
AG Ratio: 2.1 (calc) (ref 1.0–2.5)
ALT: 22 U/L (ref 9–46)
AST: 15 U/L (ref 10–35)
Albumin: 4.7 g/dL (ref 3.6–5.1)
Alkaline phosphatase (APISO): 44 U/L (ref 35–144)
BUN: 15 mg/dL (ref 7–25)
CO2: 29 mmol/L (ref 20–32)
Calcium: 10 mg/dL (ref 8.6–10.3)
Chloride: 104 mmol/L (ref 98–110)
Creat: 0.95 mg/dL (ref 0.70–1.35)
Globulin: 2.2 g/dL (calc) (ref 1.9–3.7)
Glucose, Bld: 92 mg/dL (ref 65–99)
Potassium: 4.5 mmol/L (ref 3.5–5.3)
Sodium: 141 mmol/L (ref 135–146)
Total Bilirubin: 1.1 mg/dL (ref 0.2–1.2)
Total Protein: 6.9 g/dL (ref 6.1–8.1)
eGFR: 90 mL/min/{1.73_m2} (ref >=60)

## 2022-06-24 LAB — LIPID PANEL
Cholesterol: 158 mg/dL (ref <200)
HDL: 56 mg/dL (ref >=40)
LDL Cholesterol (Calc): 83 mg/dL (calc)
Non-HDL Cholesterol (Calc): 102 mg/dL (calc) (ref <130)
Total CHOL/HDL Ratio: 2.8 (calc) (ref <5.0)
Triglycerides: 95 mg/dL (ref <150)

## 2022-06-24 LAB — MAGNESIUM: Magnesium: 2.3 mg/dL (ref 1.5–2.5)

## 2022-08-14 ENCOUNTER — Encounter: Payer: Self-pay | Admitting: Internal Medicine

## 2022-10-30 ENCOUNTER — Other Ambulatory Visit: Payer: Self-pay | Admitting: Internal Medicine

## 2022-10-30 ENCOUNTER — Other Ambulatory Visit: Payer: Self-pay | Admitting: Nurse Practitioner

## 2022-10-30 DIAGNOSIS — Z79899 Other long term (current) drug therapy: Secondary | ICD-10-CM

## 2022-10-30 DIAGNOSIS — E349 Endocrine disorder, unspecified: Secondary | ICD-10-CM

## 2022-10-30 MED ORDER — TADALAFIL 20 MG PO TABS: ORAL_TABLET | ORAL | 3 refills | Status: AC

## 2022-11-04 ENCOUNTER — Telehealth: Payer: Self-pay

## 2022-11-04 NOTE — Telephone Encounter (Signed)
Prior auth completed and submitted.

## 2022-12-01 ENCOUNTER — Other Ambulatory Visit: Payer: Self-pay

## 2022-12-01 DIAGNOSIS — E782 Mixed hyperlipidemia: Secondary | ICD-10-CM

## 2022-12-01 MED ORDER — ROSUVASTATIN CALCIUM 20 MG PO TABS: ORAL_TABLET | ORAL | 3 refills | Status: AC

## 2022-12-02 ENCOUNTER — Ambulatory Visit: Payer: BC Managed Care – PPO | Admitting: Nurse Practitioner

## 2022-12-09 NOTE — Progress Notes (Unsigned)
FOLLOW UP  Assessment and Plan:   Hypertension Recently well controlled off of medications  Monitor blood pressure at home; patient to call if consistently greater than 130/80 Continue DASH diet.   Reminder to go to the ER if any CP, SOB, nausea, dizziness, severe HA, changes vision/speech, left arm numbness and tingling and jaw pain. Check CBC  Cholesterol Currently at goal; continue rosuvastatin 20 mg daily  Continue low cholesterol diet and exercise.  Check lipid panel.  Check CMP  Other abnormal glucose Last several A1Cs at goal Continue diet and exercise.  Perform daily foot/skin check, notify office of any concerning changes.  Defer A1C; check CMP/GFR  Obesity with co morbidities Long discussion about weight loss, diet, and exercise Recommended diet heavy in fruits and veggies and low in animal meats, cheeses, and dairy products, appropriate calorie intake Will follow up in 3 months  Vitamin D Def At goal at last visit; continue supplementation to maintain goal of 70-100 Defer Vit D level  Hypogonadism - continue replacement therapy, zinc supplement, reviewed weight loss, encouraged HIIT and resistance exercises, check testosterone level.    Colostomy (HCC)/history of rectal cancer Doing well without complications.  Herniation on one side.   Chronic Left Shoulder Pain Currently taking Meloxicam Prn and Voltaren gel  Medication Management    Continue diet and meds as discussed. Further disposition pending results of labs. Discussed med's effects and SE's.   Over 30 minutes of exam, counseling, chart review, and critical decision making was performed.   Future Appointments  Date Time Provider Department Center  12/10/2022  2:30 PM [name], [name], NP [facility] None  03/10/2023 10:00 AM Lucky CowboyMcKeown, William, MD [facility] None    ----------------------------------------------------------------------------------------------------------------------  HPI 62  y.o. male  presents for 3 month follow up on hypertension, cholesterol, prediabetes, hypogonadism on testosterone supplementations, obesity and vitamin D deficiency.   He has hx of rectal cancer with resection and colostomy in 2009 - he is doing well since.   He is having Left shoulder pain which has been occurring x 6 months.  He describes it as a sharp pain in the shoulder joint which is worse when lifting something overhead. He is currently using Voltaren gel and it is helping.   BMI is There is no height or weight on file to calculate BMI., he has been working on diet and exercise.  He is walking daily. Wt Readings from Last 3 Encounters:  06/23/22 206 lb 6.4 oz (93.6 kg)  03/06/22 205 lb 6.4 oz (93.2 kg)  09/06/21 209 lb 3.2 oz (94.9 kg)   He has not been checking his BP at home, doing well off of medications for several years, today their BP is   BP Readings from Last 3 Encounters:  06/23/22 138/80  03/06/22 (!) 151/101  09/06/21 128/82     He does workout - he walks 30 mins daily.  He denies chest pain, shortness of breath, dizziness.   He is on cholesterol medication - rosuvastatin 20 mg daily - and denies myalgias. His LDL cholesterol is at goal. The cholesterol last visit was:   Lab Results  Component Value Date   CHOL 158 06/23/2022   HDL 56 06/23/2022   LDLCALC 83 06/23/2022   TRIG 95 06/23/2022   CHOLHDL 2.8 06/23/2022    He has been working on diet and exercise for glucose management, and denies increased appetite, nausea, paresthesia of the feet, polydipsia, polyuria, visual disturbances and vomiting. Last A1C in the office was:  Lab Results  Component Value Date   HGBA1C 5.5 03/06/2022   Patient is on Vitamin D supplement and at goal at the last check:    Lab Results  Component Value Date   VD25OH 83 03/06/2022     He has a history of testosterone deficiency and is on testosterone replacement, using topical testosterone. He states that the testosterone helps  with his energy, libido, muscle mass. Lab Results  Component Value Date   TESTOSTERONE 120 (L) 03/06/2022   Patient has dx of gout but not on allopurinol in last few years and does not report a recent flare, he has worked on lifestyle modification (avoiding processed meats and limiting alcohol).  Lab Results  Component Value Date   LABURIC 6.5 03/06/2022     Current Medications:  Current Outpatient Medications on File Prior to Visit  Medication Sig   aspirin 81 MG tablet Take 81 mg by mouth daily.   hyoscyamine (LEVSIN) 0.125 MG tablet Take 1 to 2 tablets every 4 to 6 hours as needed for Nausea, Cramping or Bloating (Patient not taking: Reported on 06/23/2022)   IRON PO Take 500 mg by mouth every other day.   LOTEMAX 0.5 % GEL    meloxicam (MOBIC) 15 MG tablet Take 1/2 to 1 tablet Daily with Food for Pain & Inflammation   rosuvastatin (CRESTOR) 20 MG tablet TAKE ONE TABLET BY MOUTH DAILY FOR CHOLESTEROL   tadalafil (CIALIS) 20 MG tablet Take  1/2 to 1 tablet  every 2 to 3 days  as needed  for XXXX                                                                        /                                             TAKE                                     BY                                 MOUTH   Testosterone 20.25 MG/ACT (1.62%) GEL APPLY 2 PUMPS TO THE SKIN ONCE DAILY   vitamin B-12 (CYANOCOBALAMIN) 500 MCG tablet Take 500 mcg by mouth daily.   VITAMIN D PO Take 5,000 Units by mouth 2 (two) times daily.   zinc gluconate 50 MG tablet Take 50 mg by mouth daily.   No current facility-administered medications on file prior to visit.     Allergies:  Allergies  Allergen Reactions   Oxycodone Nausea And Vomiting     Medical History:  Past Medical History:  Diagnosis Date   Arthritis    Hyperlipidemia    Hypertension    Hypogonadism male    Pre-diabetes    Rectal adenocarcinoma (HCC) 2009   colostomy   Family history- Reviewed and unchanged Social history- Reviewed and  unchanged   Review of Systems:  Review of Systems  Constitutional:  Negative for malaise/fatigue and weight loss.  HENT:  Negative for hearing loss and tinnitus (Ongoing chronic, bilateral, improved with hearing aids).   Eyes:  Negative for blurred vision and double vision.  Respiratory:  Negative for cough, shortness of breath and wheezing.   Cardiovascular:  Negative for chest pain, palpitations, orthopnea, claudication and leg swelling.  Gastrointestinal:  Negative for abdominal pain, blood in stool, constipation, diarrhea, heartburn, melena, nausea and vomiting.  Genitourinary: Negative.   Musculoskeletal:  Positive for joint pain (left shoulder). Negative for myalgias.  Skin:  Negative for rash.  Neurological:  Negative for dizziness, tingling, sensory change, weakness and headaches.  Endo/Heme/Allergies:  Negative for polydipsia.  Psychiatric/Behavioral: Negative.    All other systems reviewed and are negative.   Physical Exam: There were no vitals taken for this visit. Wt Readings from Last 3 Encounters:  06/23/22 206 lb 6.4 oz (93.6 kg)  03/06/22 205 lb 6.4 oz (93.2 kg)  09/06/21 209 lb 3.2 oz (94.9 kg)   General Appearance: Well nourished, in no apparent distress. Eyes: PERRLA, EOMs, conjunctiva no swelling or erythema Sinuses: No Frontal/maxillary tenderness ENT/Mouth: Ext aud canals clear, TMs without erythema, bulging. No erythema, swelling, or exudate on post pharynx.  Tonsils not swollen or erythematous. With bilateral hearing aids.  Neck: Supple, thyroid normal.  Respiratory: Respiratory effort normal, BS equal bilaterally without rales, rhonchi, wheezing or stridor.  Cardio: RRR with no MRGs. Brisk peripheral pulses without edema.  Abdomen: Soft, + BS.  Non tender, no guarding, rebound, masses. Colostomy bag present to left without ulcers, skin breakdown, purulent discharge/mucus.  Lymphatics: Non tender without lymphadenopathy.  Musculoskeletal: Full ROM, 5/5  strength, Normal gait. Unable to reproduce shoulder pain with exam Skin: Warm, dry without rashes, ecchymosis.  Neuro: Cranial nerves intact. No cerebellar symptoms.  Psych: Awake and oriented X 3, normal affect, Insight and Judgment appropriate.   Raynelle Dick, NP 12:32 PM [facility]

## 2022-12-10 ENCOUNTER — Encounter: Payer: Self-pay | Admitting: Nurse Practitioner

## 2022-12-10 ENCOUNTER — Ambulatory Visit: Payer: BC Managed Care – PPO | Admitting: Nurse Practitioner

## 2022-12-10 VITALS — BP 132/80 | HR 78 | Temp 97.7°F | Ht 68.0 in | Wt 216.4 lb

## 2022-12-10 DIAGNOSIS — R0989 Other specified symptoms and signs involving the circulatory and respiratory systems: Secondary | ICD-10-CM

## 2022-12-10 DIAGNOSIS — G8929 Other chronic pain: Secondary | ICD-10-CM

## 2022-12-10 DIAGNOSIS — R7309 Other abnormal glucose: Secondary | ICD-10-CM

## 2022-12-10 DIAGNOSIS — E559 Vitamin D deficiency, unspecified: Secondary | ICD-10-CM | POA: Diagnosis not present

## 2022-12-10 DIAGNOSIS — Z79899 Other long term (current) drug therapy: Secondary | ICD-10-CM

## 2022-12-10 DIAGNOSIS — Z85048 Personal history of other malignant neoplasm of rectum, rectosigmoid junction, and anus: Secondary | ICD-10-CM

## 2022-12-10 DIAGNOSIS — Z933 Colostomy status: Secondary | ICD-10-CM | POA: Diagnosis not present

## 2022-12-10 DIAGNOSIS — E291 Testicular hypofunction: Secondary | ICD-10-CM

## 2022-12-10 DIAGNOSIS — E782 Mixed hyperlipidemia: Secondary | ICD-10-CM

## 2022-12-10 DIAGNOSIS — E669 Obesity, unspecified: Secondary | ICD-10-CM

## 2022-12-10 DIAGNOSIS — M5442 Lumbago with sciatica, left side: Secondary | ICD-10-CM

## 2022-12-10 NOTE — Patient Instructions (Signed)
Hip Exercises Ask your health care provider which exercises are safe for you. Do exercises exactly as told by your provider and adjust them as told. It is normal to feel mild stretching, pulling, tightness, or discomfort as you do these exercises. Stop right away if you feel sudden pain or your pain gets worse. Do not begin these exercises until told by your provider. Stretching and range-of-motion exercises These exercises warm up your muscles and joints and improve the movement and flexibility of your hip. They also help to relieve pain, numbness, and tingling. You may be asked to limit your range of motion if you had a hip replacement. Talk to your provider about these limits. Hamstrings, supine  Lie on your back (supine position). Loop a belt, towel, or exercise band over the ball of your left / right foot. The ball of your foot is on the walking surface, right under your toes. Straighten your left / right knee and slowly pull on the belt, towel, or band to raise your leg until you feel a gentle stretch behind your knee (hamstring). Do not let your knee bend while you do this. Keep your other leg flat on the floor. Hold this position for ___10-15_______ seconds. Slowly return your leg to the starting position. Repeat ______5___ times. Complete this exercise _____1-2_____ times a day. Hip rotation  Lie on your back on a firm surface. With your left / right hand, gently pull your left / right knee toward the shoulder that is on the same side of the body. Stop when your knee is pointing toward the ceiling. Hold your left / right ankle with your other hand. Keeping your knee steady, gently pull your left / right ankle toward your other shoulder until you feel a stretch in your butt. Keep your hips and shoulders firmly planted while you do this stretch. Hold this position for __10-15________ seconds. Repeat _____5_____ times. Complete this exercise ___1-2_______ times a day. Seated  stretch This exercise is sometimes called hamstrings and adductors stretch. Sit on the floor with your legs stretched wide. Keep your knees straight during this exercise. Keeping your head and back in a straight line, bend at your waist to reach for your left foot (position A). You should feel a stretch in your right inner thigh (adductors). Hold this position for ______15____ seconds. Then slowly return to the upright position. Keeping your head and back in a straight line, bend at your waist to reach forward (position B). You should feel a stretch behind both of your thighs and knees (hamstrings). Hold this position for ___15_______ seconds. Then slowly return to the upright position. Keeping your head and back in a straight line, bend at your waist to reach for your right foot (position C). You should feel a stretch in your left inner thigh (adductors). Hold this position for ___15_______ seconds. Then slowly return to the upright position. Repeat ____5______ times. Complete this exercise _____1-2_____ times a day. Lunge This exercise stretches the muscles of the hip (hip flexors). Place your left / right knee on the floor and bend your other knee so that is directly over your ankle. You should be half-kneeling. Keep good posture with your head over your shoulders. Tighten your butt muscles to point your tailbone downward. This will prevent your back from arching too much. You should feel a gentle stretch in the front of your left / right thigh and hip. If you do not feel a stretch, slide your other foot forward slightly and  then slowly lunge forward with your chest up until your knee once again lines up over your ankle. Make sure your tailbone continues to point downward. Hold this position for _____15_____ seconds. Slowly return to the starting position. Repeat ____5______ times. Complete this exercise ____1-2______ times a day. Strengthening exercises These exercises build strength and  endurance in your hip. Endurance is the ability to use your muscles for a long time, even after they get tired. Bridge This exercise strengthens the muscles of your hip (hip extensors). Lie on your back on a firm surface with your knees bent and your feet flat on the floor. Tighten your butt muscles and lift your bottom off the floor until the trunk of your body and your hips are level with your thighs. Do not arch your back. You should feel the muscles working in your butt and the back of your thighs. If you do not feel these muscles, slide your feet 1-2 inches (2.5-5 cm) farther away from your butt. Hold this position for ___15_______ seconds. Slowly lower your hips to the starting position. Let your muscles relax completely between repetitions. Repeat __5________ times. Complete this exercise _1-2_________ times a day. Straight leg raises, side-lying This exercise strengthens the muscles that move the hip joint away from the center of the body (hip abductors). Lie on your side with your left / right leg in the top position. Lie so your head, shoulder, hip, and knee line up. You may bend your bottom knee slightly to help you balance. Roll your hips slightly forward, so your hips are stacked directly over each other and your left / right knee is facing forward. Leading with your heel, lift your top leg 4-6 inches (10-15 cm). You should feel the muscles in your top hip lifting. Do not let your foot drift forward. Do not let your knee roll toward the ceiling. Hold this position for ________15__ seconds. Slowly return to the starting position. Let your muscles relax completely between repetitions. Repeat ____5______ times. Complete this exercise ______1-2____ times a day. Straight leg raises, side-lying This exercise strengthens the muscles that move the hip joint toward the center of the body (hip adductors). Lie on your side with your left / right leg in the bottom position. Lie so your head,  shoulder, hip, and knee line up. You may place your upper foot in front to help you balance. Roll your hips slightly forward, so your hips are stacked directly over each other and your left / right knee is facing forward. Tense the muscles in your inner thigh and lift your bottom leg 4-6 inches (10-15 cm). Hold this position for __15________ seconds. Slowly return to the starting position. Let your muscles relax completely between repetitions. Repeat ____5______ times. Complete this exercise ______1-2____ times a day. Straight leg raises, supine This exercise strengthens the muscles in the front of your thigh (quadriceps and hip flexors). Lie on your back (supine position) with your left / right leg extended and your other knee bent. Tense the muscles in the front of your left / right thigh. You should see your kneecap slide up or see increased dimpling just above your knee. Keep these muscles tight as you raise your leg 4-6 inches (10-15 cm) off the floor. Do not let your knee bend. Hold this position for _____15_____ seconds. Keep these muscles tense as you lower your leg. Relax the muscles slowly and completely between repetitions. Repeat _______5___ times. Complete this exercise _____1-2_____ times a day. Hip abductors, standing This  exercise strengthens the muscles that move the leg and hip joint away from the center of the body (hip abductors). Tie one end of a rubber exercise band or tubing to a secure surface, such as a chair, table, or pole. Loop the other end of the band or tubing around your left / right ankle. Keeping your ankle with the band or tubing directly opposite the secured end, step away until there is tension in the tubing or band. Hold on to a chair, table, or pole as needed for balance. Lift your left / right leg out to your side. While you do this: Keep your back upright. Keep your shoulders over your hips. Keep your toes pointing forward. Make sure to use your hip  muscles to slowly lift your leg. Do not tip your body or forcefully lift your leg. Hold this position for ____15______ seconds. Slowly return to the starting position. Repeat _____5_____ times. Complete this exercise ____1-2______ times a day. Squats This exercise strengthens the muscles in the front of your thigh (quadriceps). Stand in front of a table, or stand in a doorframe so your feet and knees are in line with the frame. You may place your hands on the table or frame for balance. Slowly bend your knees and lower your hips like you are going to sit in a chair. Keep your lower legs in a straight up-and-down position. Do not let your hips go lower than your knees. Do not bend your knees lower than told by your provider. If your hip pain increases, do not bend as low. Hold this position for ___5________ seconds. Slowly push with your legs to return to standing. Do not use your hands to pull yourself to standing. Repeat ______5____ times. Complete this exercise _1-2_________ times a day. This information is not intended to replace advice given to you by your health care provider. Make sure you discuss any questions you have with your health care provider. Document Revised: 04/22/2022 Document Reviewed: 04/22/2022 Elsevier Patient Education  2023 [employer]

## 2022-12-11 LAB — CBC WITH DIFFERENTIAL/PLATELET
Absolute Monocytes: 382 cells/uL (ref 200–950)
Basophils Absolute: 23 cells/uL (ref 0–200)
Basophils Relative: 0.4 %
Eosinophils Absolute: 120 cells/uL (ref 15–500)
Eosinophils Relative: 2.1 %
HCT: 44.1 % (ref 38.5–50.0)
Hemoglobin: 14.8 g/dL (ref 13.2–17.1)
Lymphs Abs: 1630 cells/uL (ref 850–3900)
MCH: 28.6 pg (ref 27.0–33.0)
MCHC: 33.6 g/dL (ref 32.0–36.0)
MCV: 85.3 fL (ref 80.0–100.0)
MPV: 11.3 fL (ref 7.5–12.5)
Monocytes Relative: 6.7 %
Neutro Abs: 3545 cells/uL (ref 1500–7800)
Neutrophils Relative %: 62.2 %
Platelets: 185 10*3/uL (ref 140–400)
RBC: 5.17 10*6/uL (ref 4.20–5.80)
RDW: 13.2 % (ref 11.0–15.0)
Total Lymphocyte: 28.6 %
WBC: 5.7 10*3/uL (ref 3.8–10.8)

## 2022-12-11 LAB — LIPID PANEL
Cholesterol: 165 mg/dL (ref <200)
HDL: 45 mg/dL (ref >=40)
LDL Cholesterol (Calc): 92 mg/dL (calc)
Non-HDL Cholesterol (Calc): 120 mg/dL (calc) (ref <130)
Total CHOL/HDL Ratio: 3.7 (calc) (ref <5.0)
Triglycerides: 188 mg/dL — ABNORMAL HIGH (ref <150)

## 2022-12-11 LAB — COMPLETE METABOLIC PANEL WITH GFR
AG Ratio: 2 (calc) (ref 1.0–2.5)
ALT: 24 U/L (ref 9–46)
AST: 16 U/L (ref 10–35)
Albumin: 4.4 g/dL (ref 3.6–5.1)
Alkaline phosphatase (APISO): 43 U/L (ref 35–144)
BUN: 20 mg/dL (ref 7–25)
CO2: 31 mmol/L (ref 20–32)
Calcium: 9.7 mg/dL (ref 8.6–10.3)
Chloride: 104 mmol/L (ref 98–110)
Creat: 1.05 mg/dL (ref 0.70–1.35)
Globulin: 2.2 g/dL (calc) (ref 1.9–3.7)
Glucose, Bld: 94 mg/dL (ref 65–99)
Potassium: 4.6 mmol/L (ref 3.5–5.3)
Sodium: 143 mmol/L (ref 135–146)
Total Bilirubin: 0.8 mg/dL (ref 0.2–1.2)
Total Protein: 6.6 g/dL (ref 6.1–8.1)
eGFR: 80 mL/min/{1.73_m2} (ref >=60)

## 2022-12-11 LAB — VITAMIN D 25 HYDROXY (VIT D DEFICIENCY, FRACTURES): Vit D, 25-Hydroxy: 87 ng/mL (ref 30–100)

## 2022-12-11 LAB — TSH: TSH: 1.58 mIU/L (ref 0.40–4.50)

## 2022-12-31 NOTE — Telephone Encounter (Signed)
Prior auth denied. 

## 2023-03-10 ENCOUNTER — Encounter: Payer: BC Managed Care – PPO | Admitting: Internal Medicine

## 2023-03-26 ENCOUNTER — Encounter: Payer: BC Managed Care – PPO | Admitting: Internal Medicine

## 2023-04-29 ENCOUNTER — Encounter: Payer: Self-pay | Admitting: Internal Medicine

## 2023-04-29 NOTE — Patient Instructions (Signed)

## 2023-04-29 NOTE — Progress Notes (Unsigned)
Annual  Screening/Preventative Visit  & Comprehensive Evaluation & Examination   Future Appointments  Date Time Provider Department  04/30/2023                         cpe 11:00 AM Lucky Cowboy, MD [facility]  05/10/2024                            cpe 11:00 AM Lucky Cowboy, MD [facility]          This very nice 62 y.o. MWM with  HTN, HLD, Prediabetes and Vitamin D Deficiency  presents for a Screening /Preventative Visit & comprehensive evaluation and management of multiple medical co-morbidities.   Patient had resection of a Rectal villous adenoma in 2006 & has a  Colostomy.   Last colonoscopy was in Feb 2018  & in March 2023, Dr Christella Hartigan sent patient a reminder letter recommending scheduling a  colonoscopy. Patient has hx/o Gout controlled on Allopurinol.        HTN predates circa 2012. Patient's BP has been controlled at home.  Today's BP is                          . Patient denies any cardiac symptoms as chest pain, palpitations, shortness of breath, dizziness or ankle swelling.       Patient's hyperlipidemia is controlled with diet and Rosuvastatin. Patient denies myalgias or other medication SE's. Last lipids were at goal :  Lab Results  Component Value Date   CHOL 165 12/10/2022   HDL 45 12/10/2022   LDLCALC 92 12/10/2022   TRIG 188 (H) 12/10/2022   CHOLHDL 3.7 12/10/2022         Patient has moderate obesity (BMI 33+) & consequent  prediabetes (A1c 5.7% /2012).  Patient denies reactive hypoglycemic symptoms, visual blurring, diabetic polys or paresthesias. Last A1c was normal & at goal :   Lab Results  Component Value Date   HGBA1C 5.5 03/06/2022             Patient has hx/o Testosterone  Deficiency ("260" /2012) & is on replacement by gel with improved stamina & sense of well being.  Finally, patient has history of Vitamin D Deficiency ("14" /2009) and last vitamin D was at goal :   Lab Results  Component Value Date   VD25OH 87 12/10/2022          aspirin 81 MG tablet, Take daily   IRON , Take 500 mg every other day   LOTEMAX 0.5 % GEL   meloxicam 15 MG tablet, Take 1/2 to 1 tablet Daily    phentermine 37.5 MG tablet, Take  1/2 to 1 tablet  every Morning     rosuvastatin 20 MG tablet, TAKE ONE TABLET DAILY    tadalafil 20 MG tablet, Take  1/2 to 1 tablet  every 2 to 3 days  as needed    Testosterone 20.25 MG/ACT (1.62%) GEL, APPLY 2 PUMPS DAILY   topiramate 50 MG tablet, Take  1/2 to 1 tablet  2 x /day  at Suppertime & Bedtime    vitamin B-12 500 MCG tablet, Take  daily   VITAMIN D 5,000 Units, Take   2 times daily   zinc 50 MG tablet, Take  daily    Allergies  Allergen Reactions   Oxycodone Nausea And Vomiting    Past Medical  History:  Diagnosis Date   Arthritis    Hyperlipidemia    Hypertension    Hypogonadism male    Pre-diabetes    Rectal adenocarcinoma (HCC) 2009   colostomy    Health Maintenance  Topic Date Due   Pneumococcal Vaccine Never done   Zoster Vaccines- Shingrix (1 of 2) Never done   TETANUS/TDAP  02/16/2018   COVID-19 Vaccine (3 - Pfizer risk series) 03/04/2020   INFLUENZA VACCINE  04/01/2021   COLONOSCOPY 10/21/2021   Hepatitis C Screening  Completed   HIV Screening  Completed   HPV VACCINES  Aged Out    Immunization History  Administered Date(s) Administered   PFIZER Covid-19 Tri-Sucrose Vacc 01/15/2020, 02/05/2020   PPD Test 09/20/2015, 04/16/2017, 05/18/2018   Tdap 02/17/2008    Last Colon - 10/21/2016 - Dr Christella Hartigan - 5 yr  F/U overdue since 10/2021.  Past Surgical History:  Procedure Laterality Date   ABDOMINOPERINEAL PROCTOCOLECTOMY N/A 07/18/2008   Dr Byrd Hesselbach, [facility] [place].  Very distal rectal cancer   colonoscopsy  09/2011   PARASTOMAL HERNIA REPAIR  2010   Dr Mirian Mo,  [facility]   RECONSTRUCT / STABILIZE DISTAL ULNA     WISDOM TOOTH EXTRACTION       Family History  Problem Relation Age of Onset   COPD Mother    Alcohol abuse Father    Cirrhosis Father    COPD Sister    Alcohol  abuse Brother    Diabetes Maternal Grandmother    Colon cancer Neg Hx     Social History   Socioeconomic History   Marital status: Married    Spouse name: Not on file   Number of children: Not on file  Occupational History   Not on file  Tobacco Use   Smoking status: Never   Smokeless tobacco: Never  Substance and Sexual Activity   Alcohol use: Yes    Alcohol/week: 4.0 standard drinks    Types: 4 Glasses of wine per week    Comment: 4   Drug use: No   Sexual activity: Not on file    ROS Constitutional: Denies fever, chills, weight loss/gain, headaches, insomnia,  night sweats or change in appetite. Does c/o fatigue. Eyes: Denies redness, blurred vision, diplopia, discharge, itchy or watery eyes.  ENT: Denies discharge, congestion, post nasal drip, epistaxis, sore throat, earache, hearing loss, dental pain, Tinnitus, Vertigo, Sinus pain or snoring.  Cardio: Denies chest pain, palpitations, irregular heartbeat, syncope, dyspnea, diaphoresis, orthopnea, PND, claudication or edema Respiratory: denies cough, dyspnea, DOE, pleurisy, hoarseness, laryngitis or wheezing.  Gastrointestinal: Denies dysphagia, heartburn, reflux, water brash, pain, cramps, nausea, vomiting, bloating, diarrhea, constipation, hematemesis, melena, hematochezia, jaundice or hemorrhoids Genitourinary: Denies dysuria,  discharge, hematuria or flank pain. Reports Urinary frequency & Nocturia 2 - 10 x /night with decreased urinary stream& occas urgency & incontinence Musculoskeletal: Denies arthralgia, myalgia, stiffness, Jt. Swelling, pain, limp or strain/sprain. Denies Falls. Skin: Denies puritis, rash, hives, warts, acne, eczema or change in skin lesion Neuro: No weakness, tremor, incoordination, spasms, paresthesia or pain Psychiatric: Denies confusion, memory loss or sensory loss. Denies Depression. Endocrine: Denies change in weight, skin, hair change, nocturia, and paresthesia, diabetic polys, visual blurring  or hyper / hypo glycemic episodes.  Heme/Lymph: No excessive bleeding, bruising or enlarged lymph nodes.   Physical Exam  There were no vitals taken for this visit.  General Appearance: Over nourished and well groomed and in no apparent distress.  Eyes: PERRLA, EOMs, conjunctiva no swelling or erythema, normal fundi  and vessels. Sinuses: No frontal/maxillary tenderness ENT/Mouth: EACs patent / TMs  nl. Nares clear without erythema, swelling, mucoid exudates. Oral hygiene is good. No erythema, swelling, or exudate. Tongue normal, non-obstructing. Tonsils not swollen or erythematous. Hearing normal.  Neck: Supple, thyroid not palpable. No bruits, nodes or JVD. Respiratory: Respiratory effort normal.  BS equal and clear bilateral without rales, rhonci, wheezing or stridor. Cardio: Heart sounds are normal with regular rate and rhythm and no murmurs, rubs or gallops. Peripheral pulses are normal and equal bilaterally without edema. No aortic or femoral bruits. Chest: symmetric with normal excursions and percussion.  Abdomen: Soft, with Nl bowel sounds.  LLQ colostomy.  Moderate laxity of the abdominal musculature with rectus diastasis.  Non tender, no guarding, rebound, hernias, masses, or organomegaly.  Lymphatics: Non tender without lymphadenopathy.  Musculoskeletal: Full ROM all peripheral extremities, joint stability, 5/5 strength, and normal gait. Skin: Warm and dry without rashes, lesions, cyanosis, clubbing or  ecchymosis.  Neuro: Cranial nerves intact, reflexes equal bilaterally. Normal muscle tone, no cerebellar symptoms. Sensation intact.  Pysch: Alert and oriented X 3 with normal affect, insight and judgment appropriate.   Assessment and Plan  1. Annual Preventative/Screening Exam  2. Labile hypertension  - EKG 12-Lead - [place], RETROPERITNL ABD,  LTD - Urinalysis, Routine w reflex microscopic - Microalbumin / creatinine urine ratio - CBC with Differential/Platelet - COMPLETE  METABOLIC PANEL WITH GFR - Magnesium - TSH   3. Hyperlipidemia, mixed  - EKG 12-Lead - [place], RETROPERITNL ABD,  LTD - Lipid panel - TSH   4. Abnormal glucose  - EKG 12-Lead - [place], RETROPERITNL ABD,  LTD - Hemoglobin A1c - Insulin, random   5. Vitamin D deficiency  - VITAMIN D 25 Hydroxy   6. Testosterone deficiency  - Testosterone   7. Idiopathic gout  - Uric acid   8. Screening-pulmonary TB  - TB Skin Test   9. History of rectal cancer  - Ambulatory referral to Gastroenterology   10. Colostomy  (HCC)   11. Screening for colorectal cancer  - Ambulatory referral to Gastroenterology - POC Hemoccult Bld/Stl    12. Prostate cancer screening  - PSA   13. Screening for heart disease  - EKG 12-Lead   14. Screening for AAA (aortic abdominal aneurysm)  - [place], RETROPERITNL ABD,  LTD   15. Fatigue - Vitamin B12 - Iron, TIBC and Ferritin Panel - CBC with Differential/Platelet - TSH   16. Medication management  - Urinalysis, Routine w reflex microscopic - Microalbumin / creatinine urine ratio - Uric acid - CBC with Differential/Platelet - COMPLETE METABOLIC PANEL WITH GFR - Magnesium - Lipid panel - TSH - Hemoglobin A1c - Insulin, random - VITAMIN D 25 Hydroxy           Patient was counseled in prudent diet, weight control to achieve/maintain BMI less than 25, BP monitoring, regular exercise and medications as discussed.  Discussed med effects and SE's. Routine screening labs and tests as requested with regular follow-up as recommended. Over 40 minutes of exam, counseling, chart review and high complex critical decision making was performed   Marinus Maw, MD

## 2023-04-30 ENCOUNTER — Encounter: Payer: Self-pay | Admitting: Internal Medicine

## 2023-04-30 ENCOUNTER — Ambulatory Visit (INDEPENDENT_AMBULATORY_CARE_PROVIDER_SITE_OTHER): Payer: BC Managed Care – PPO | Admitting: Internal Medicine

## 2023-04-30 VITALS — BP 160/80 | HR 80 | Temp 97.9°F | Resp 16 | Ht 68.0 in | Wt 214.2 lb

## 2023-04-30 DIAGNOSIS — E559 Vitamin D deficiency, unspecified: Secondary | ICD-10-CM

## 2023-04-30 DIAGNOSIS — Z131 Encounter for screening for diabetes mellitus: Secondary | ICD-10-CM | POA: Diagnosis not present

## 2023-04-30 DIAGNOSIS — M1000 Idiopathic gout, unspecified site: Secondary | ICD-10-CM

## 2023-04-30 DIAGNOSIS — N401 Enlarged prostate with lower urinary tract symptoms: Secondary | ICD-10-CM | POA: Diagnosis not present

## 2023-04-30 DIAGNOSIS — Z130 Encounter for screening for diseases of the blood and blood-forming organs and certain disorders involving the immune mechanism: Secondary | ICD-10-CM | POA: Diagnosis not present

## 2023-04-30 DIAGNOSIS — Z1329 Encounter for screening for other suspected endocrine disorder: Secondary | ICD-10-CM

## 2023-04-30 DIAGNOSIS — E349 Endocrine disorder, unspecified: Secondary | ICD-10-CM

## 2023-04-30 DIAGNOSIS — Z13220 Encounter for screening for lipoid disorders: Secondary | ICD-10-CM | POA: Diagnosis not present

## 2023-04-30 DIAGNOSIS — Z79899 Other long term (current) drug therapy: Secondary | ICD-10-CM | POA: Diagnosis not present

## 2023-04-30 DIAGNOSIS — Z0001 Encounter for general adult medical examination with abnormal findings: Secondary | ICD-10-CM

## 2023-04-30 DIAGNOSIS — Z1389 Encounter for screening for other disorder: Secondary | ICD-10-CM | POA: Diagnosis not present

## 2023-04-30 DIAGNOSIS — R7309 Other abnormal glucose: Secondary | ICD-10-CM

## 2023-04-30 DIAGNOSIS — R0989 Other specified symptoms and signs involving the circulatory and respiratory systems: Secondary | ICD-10-CM | POA: Diagnosis not present

## 2023-04-30 DIAGNOSIS — Z933 Colostomy status: Secondary | ICD-10-CM

## 2023-04-30 DIAGNOSIS — Z111 Encounter for screening for respiratory tuberculosis: Secondary | ICD-10-CM

## 2023-04-30 DIAGNOSIS — Z125 Encounter for screening for malignant neoplasm of prostate: Secondary | ICD-10-CM

## 2023-04-30 DIAGNOSIS — R350 Frequency of micturition: Secondary | ICD-10-CM

## 2023-04-30 DIAGNOSIS — Z136 Encounter for screening for cardiovascular disorders: Secondary | ICD-10-CM

## 2023-04-30 DIAGNOSIS — Z1211 Encounter for screening for malignant neoplasm of colon: Secondary | ICD-10-CM

## 2023-04-30 DIAGNOSIS — Z0000 Encounter for general adult medical examination without abnormal findings: Secondary | ICD-10-CM

## 2023-04-30 DIAGNOSIS — I700 Atherosclerosis of aorta: Secondary | ICD-10-CM | POA: Diagnosis not present

## 2023-04-30 DIAGNOSIS — E782 Mixed hyperlipidemia: Secondary | ICD-10-CM

## 2023-04-30 DIAGNOSIS — Z85048 Personal history of other malignant neoplasm of rectum, rectosigmoid junction, and anus: Secondary | ICD-10-CM

## 2023-04-30 DIAGNOSIS — R5383 Other fatigue: Secondary | ICD-10-CM

## 2023-05-01 LAB — URINALYSIS, ROUTINE W REFLEX MICROSCOPIC
Bilirubin Urine: NEGATIVE
Glucose, UA: NEGATIVE
Hgb urine dipstick: NEGATIVE
Ketones, ur: NEGATIVE
Leukocytes,Ua: NEGATIVE
Nitrite: NEGATIVE
Protein, ur: NEGATIVE
Specific Gravity, Urine: 1.012 (ref 1.001–1.035)
pH: 6.5 (ref 5.0–8.0)

## 2023-05-01 LAB — IRON,TIBC AND FERRITIN PANEL
%SAT: 32 % (ref 20–48)
Ferritin: 104 ng/mL (ref 24–380)
Iron: 96 ug/dL (ref 50–180)
TIBC: 299 ug/dL (ref 250–425)

## 2023-05-01 LAB — CBC WITH DIFFERENTIAL/PLATELET
Absolute Monocytes: 299 {cells}/uL (ref 200–950)
Basophils Absolute: 29 {cells}/uL (ref 0–200)
Basophils Relative: 0.6 %
Eosinophils Absolute: 98 {cells}/uL (ref 15–500)
Eosinophils Relative: 2 %
HCT: 44.9 % (ref 38.5–50.0)
Hemoglobin: 15 g/dL (ref 13.2–17.1)
Lymphs Abs: 1343 {cells}/uL (ref 850–3900)
MCH: 28.8 pg (ref 27.0–33.0)
MCHC: 33.4 g/dL (ref 32.0–36.0)
MCV: 86.3 fL (ref 80.0–100.0)
MPV: 11.7 fL (ref 7.5–12.5)
Monocytes Relative: 6.1 %
Neutro Abs: 3131 {cells}/uL (ref 1500–7800)
Neutrophils Relative %: 63.9 %
Platelets: 178 10*3/uL (ref 140–400)
RBC: 5.2 10*6/uL (ref 4.20–5.80)
RDW: 13.4 % (ref 11.0–15.0)
Total Lymphocyte: 27.4 %
WBC: 4.9 10*3/uL (ref 3.8–10.8)

## 2023-05-01 LAB — URIC ACID: Uric Acid, Serum: 6.4 mg/dL (ref 4.0–8.0)

## 2023-05-01 LAB — COMPLETE METABOLIC PANEL WITH GFR
AG Ratio: 2 (calc) (ref 1.0–2.5)
ALT: 20 U/L (ref 9–46)
AST: 13 U/L (ref 10–35)
Albumin: 4.5 g/dL (ref 3.6–5.1)
Alkaline phosphatase (APISO): 43 U/L (ref 35–144)
BUN: 17 mg/dL (ref 7–25)
CO2: 27 mmol/L (ref 20–32)
Calcium: 9.7 mg/dL (ref 8.6–10.3)
Chloride: 103 mmol/L (ref 98–110)
Creat: 0.96 mg/dL (ref 0.70–1.35)
Globulin: 2.2 g/dL (ref 1.9–3.7)
Glucose, Bld: 98 mg/dL (ref 65–99)
Potassium: 4.4 mmol/L (ref 3.5–5.3)
Sodium: 139 mmol/L (ref 135–146)
Total Bilirubin: 0.9 mg/dL (ref 0.2–1.2)
Total Protein: 6.7 g/dL (ref 6.1–8.1)
eGFR: 89 mL/min/{1.73_m2} (ref >=60)

## 2023-05-01 LAB — TSH: TSH: 1.83 m[IU]/L (ref 0.40–4.50)

## 2023-05-01 LAB — MICROALBUMIN / CREATININE URINE RATIO
Creatinine, Urine: 65 mg/dL (ref 20–320)
Microalb, Ur: <0.2 mg/dL

## 2023-05-01 LAB — LIPID PANEL
Cholesterol: 155 mg/dL (ref <200)
HDL: 55 mg/dL (ref >=40)
LDL Cholesterol (Calc): 74 mg/dL
Non-HDL Cholesterol (Calc): 100 mg/dL (ref <130)
Total CHOL/HDL Ratio: 2.8 (calc) (ref <5.0)
Triglycerides: 154 mg/dL — ABNORMAL HIGH (ref <150)

## 2023-05-01 LAB — VITAMIN D 25 HYDROXY (VIT D DEFICIENCY, FRACTURES): Vit D, 25-Hydroxy: 88 ng/mL (ref 30–100)

## 2023-05-01 LAB — INSULIN, RANDOM: Insulin: 67.5 u[IU]/mL — ABNORMAL HIGH

## 2023-05-01 LAB — MAGNESIUM: Magnesium: 2.1 mg/dL (ref 1.5–2.5)

## 2023-05-01 LAB — HEMOGLOBIN A1C
Hgb A1c MFr Bld: 5.7 %{Hb} — ABNORMAL HIGH (ref <5.7)
Mean Plasma Glucose: 117 mg/dL
eAG (mmol/L): 6.5 mmol/L

## 2023-05-01 LAB — VITAMIN B12: Vitamin B-12: 1856 pg/mL — ABNORMAL HIGH (ref 200–1100)

## 2023-05-01 LAB — PSA: PSA: 0.26 ng/mL

## 2023-05-01 LAB — TESTOSTERONE: Testosterone: 91 ng/dL — ABNORMAL LOW (ref 250–827)

## 2023-05-02 NOTE — Progress Notes (Signed)
<>*<>*<>*<>*<>*<>*<>*<>*<>*<>*<>*<>*<>*<>*<>*<>*<>*<>*<>*<>*<>*<>*<>*<>*<> <>*<>*<>*<>*<>*<>*<>*<>*<>*<>*<>*<>*<>*<>*<>*<>*<>*<>*<>*<>*<>*<>*<>*<>*<>  -Test results slightly outside the reference range are not unusual. If there is anything important, I will review this with you,  otherwise it is considered normal test values.  If you have further questions,  please do not hesitate to contact me at the office or via My Chart.   <>*<>*<>*<>*<>*<>*<>*<>*<>*<>*<>*<>*<>*<>*<>*<>*<>*<>*<>*<>*<>*<>*<>*<>*<> <>*<>*<>*<>*<>*<>*<>*<>*<>*<>*<>*<>*<>*<>*<>*<>*<>*<>*<>*<>*<>*<>*<>*<>*<>  -  Vitamin B12 Level is way too high , So recommend Cut dose to only take 2 x / week on Sun & Thurs  <>*<>*<>*<>*<>*<>*<>*<>*<>*<>*<>*<>*<>*<>*<>*<>*<>*<>*<>*<>*<>*<>*<>*<>*<>  - Testosterone level is very low   -  Testosterone still low   -  Be Sure taking  Zinc 50 mg tab which may help raise                                                                       Testosterone levels naturally   -  Also exercise 20-30 minutes  2 x /day will help raise Testosterone levels  -  Losing weight helps raise Testosterone levels as                                      fat tissue metabolizes Testosterone into Estrogen !                                        ( So when over weight, your fat tissue converts all                                          testosterone to Estrogen which of course                                                                                 feminizes your body ! )   <>*<>*<>*<>*<>*<>*<>*<>*<>*<>*<>*<>*<>*<>*<>*<>*<>*<>*<>*<>*<>*<>*<>*<>*<>  -  A1c = 5.7% is borderline elevated 12 week average glucose , So   Blood sugar and A1c are elevated in the borderline and  early or pre-diabetes range which has the same   300% increased risk for heart attack, stroke, cancer and   alzheimer- type vascular dementia as full blown diabetes.   But the good news is that diet, exercise with                         weight loss can cure the early diabetes at this point.   Being diabetic has a  300% increased risk for heart attack,                       stroke, cancer, and alzheimer- type vascular dementia.   It is very important that you work harder with diet by  avoiding all foods that are white except chicken, fish & calliflower.  - Avoid white rice  (brown & wild rice is OK),   - Avoid white potatoes  (sweet potatoes in moderation is OK),   White bread or wheat bread or anything made out of                        white flour like bagels, donuts, rolls, buns, biscuits, cakes,  - pastries, cookies, pizza crust, and pasta (made from white flour & egg whites)   - vegetarian pasta or spinach or wheat pasta is OK.  - Multigrain breads like Arnold's, Pepperidge Farm or                                     multigrain sandwich thins or high fiber breads like   Eureka bread or "Dave's Killer" breads that are                                                        4 to 5 grams fiber per slice !  are best.    Diet, exercise and weight loss can reverse and cure                                                                        diabetes in the early stages.    <>*<>*<>*<>*<>*<>*<>*<>*<>*<>*<>*<>*<>*<>*<>*<>*<>*<>*<>*<>*<>*<>*<>*<>*<>  - Insulin = 67.5 is shows insulin resistance - a sign of early diabetes and                  ( Normal is less than 20 )   associated with a 300 % greater risk for   heart attacks, strokes, cancer & Alzheimer type vascular dementia   - All this can be cured  and prevented with losing weight   - get Dr Francis Dowse Fuhrman's book 'the End of Diabetes" and   "the End of Dieting"  <>*<>*<>*<>*<>*<>*<>*<>*<>*<>*<>*<>*<>*<>*<>*<>*<>*<>*<>*<>*<>*<>*<>*<>*<>  -  Chol = 155  is  Excellent   - Very low risk for Heart Attack  / Stroke  <>*<>*<>*<>*<>*<>*<>*<>*<>*<>*<>*<>*<>*<>*<>*<>*<>*<>*<>*<>*<>*<>*<>*<>*<>  - PSA-0 very low - No Prostate Cancer  -  Great !   <>*<>*<>*<>*<>*<>*<>*<>*<>*<>*<>*<>*<>*<>*<>*<>*<>*<>*<>*<>*<>*<>*<>*<>*<>  -  Uric Acid / Gout test is Normal & OK   <>*<>*<>*<>*<>*<>*<>*<>*<>*<>*<>*<>*<>*<>*<>*<>*<>*<>*<>*<>*<>*<>*<>*<>*<>  - Vitamin D = 88 - Excellent - Please keep dose same   <>*<>*<>*<>*<>*<>*<>*<>*<>*<>*<>*<>*<>*<>*<>*<>*<>*<>*<>*<>*<>*<>*<>*<>*<>  -  All Else - CBC - Kidneys - Electrolytes - Liver - Magnesium & Thyroid    - all  Normal / OK  <>*<>*<>*<>*<>*<>*<>*<>*<>*<>*<>*<>*<>*<>*<>*<>*<>*<>*<>*<>*<>*<>*<>*<>*<> <>*<>*<>*<>*<>*<>*<>*<>*<>*<>*<>*<>*<>*<>*<>*<>*<>*<>*<>*<>*<>*<>*<>*<>*<>

## 2023-08-05 NOTE — Progress Notes (Unsigned)
FOLLOW UP  Assessment and Plan:   Hypertension Recently well controlled off of medications  Monitor blood pressure at home; patient to call if consistently greater than 130/80 Continue DASH diet.   Reminder to go to the ER if any CP, SOB, nausea, dizziness, severe HA, changes vision/speech, left arm numbness and tingling and jaw pain. Check CBC  Cholesterol Currently at goal; continue rosuvastatin 20 mg daily  Continue low cholesterol diet and exercise.  Check lipid panel.  Check CMP  Other abnormal glucose Last several A1Cs at goal Continue diet and exercise.  Perform daily foot/skin check, notify office of any concerning changes.  Defer A1C; check CMP/GFR  Obesity with co morbidities Long discussion about weight loss, diet, and exercise Recommended diet heavy in fruits and veggies and low in animal meats, cheeses, and dairy products, appropriate calorie intake Will follow up in 3 months  Vitamin D Def At goal at last visit; continue supplementation to maintain goal of 70-100   Hypogonadism - continue replacement therapy, zinc supplement, reviewed weight loss, encouraged HIIT and resistance exercises, check testosterone level.  -Testosterone  Colostomy (HCC)/history of rectal cancer Doing well without complications.  Herniation on one side.   Medication Management - Magnesium - CBC - CMP   Continue diet and meds as discussed. Further disposition pending results of labs. Discussed med's effects and SE's.   Over 30 minutes of exam, counseling, chart review, and critical decision making was performed.   Future Appointments  Date Time Provider Department Center  11/12/2023  9:30 AM Lucky Cowboy, MD [facility] None  02/18/2024  9:30 AM Raynelle Dick, NP [facility] None  05/26/2024 10:00 AM Lucky Cowboy, MD [facility] None    ----------------------------------------------------------------------------------------------------------------------  HPI 62  y.o. male  presents for 3 month follow up on hypertension, cholesterol, prediabetes, hypogonadism on testosterone supplementations, obesity and vitamin D deficiency.   He has hx of rectal cancer with resection and colostomy in 2009 - he is doing well since.   He has been using resistance bands and standing desk and this has helped his back pain with left leg sciatica.   BMI is Body mass index is 33.36 kg/m., he has been working on diet and exercise.  He is walking daily. Holiday time has made diet difficult  Wt Readings from Last 3 Encounters:  08/06/23 219 lb 6.4 oz (99.5 kg)  04/30/23 214 lb 3.2 oz (97.2 kg)  12/10/22 216 lb 6.4 oz (98.2 kg)   He has not been checking his BP at home, doing well off of medications for several years, today their BP is BP: 138/88. t.  BP Readings from Last 3 Encounters:  08/06/23 138/88  04/30/23 (!) 160/80  12/10/22 132/80   He does workout - he walks 30 mins daily.  He denies chest pain, shortness of breath, dizziness.   He is on cholesterol medication - rosuvastatin 20 mg daily - and denies myalgias. His LDL cholesterol is at goal. The cholesterol last visit was:   Lab Results  Component Value Date   CHOL 155 04/30/2023   HDL 55 04/30/2023   LDLCALC 74 04/30/2023   TRIG 154 (H) 04/30/2023   CHOLHDL 2.8 04/30/2023    He has been working on diet and exercise for glucose management, and denies increased appetite, nausea, paresthesia of the feet, polydipsia, polyuria, visual disturbances and vomiting. Last A1C in the office was:  Lab Results  Component Value Date   HGBA1C 5.7 (H) 04/30/2023   Patient is on Vitamin D  supplement and at goal at the last check:    Lab Results  Component Value Date   VD25OH 38 04/30/2023     He has a history of testosterone deficiency and is on testosterone replacement, using topical testosterone. He states that the testosterone helps with his energy, libido, muscle mass. Lab Results  Component Value Date    TESTOSTERONE 91 (L) 04/30/2023   Patient has dx of gout but not on allopurinol in last few years and does not report a recent flare, he has worked on lifestyle modification (avoiding processed meats and limiting alcohol).  Lab Results  Component Value Date   LABURIC 6.4 04/30/2023     Current Medications:  Current Outpatient Medications on File Prior to Visit  Medication Sig   Acetaminophen (TYLENOL ARTHRITIS PAIN PO) Take by mouth.   aspirin 81 MG tablet Take 81 mg by mouth daily.   Ibuprofen (ADVIL PO) Take by mouth.   IRON PO Take 500 mg by mouth every other day.   LOTEMAX 0.5 % GEL    meloxicam (MOBIC) 15 MG tablet Take 1/2 to 1 tablet Daily with Food for Pain & Inflammation   rosuvastatin (CRESTOR) 20 MG tablet TAKE ONE TABLET BY MOUTH DAILY FOR CHOLESTEROL   tadalafil (CIALIS) 20 MG tablet Take  1/2 to 1 tablet  every 2 to 3 days  as needed  for XXXX                                                                        /                                             TAKE                                     BY                                 MOUTH   Testosterone 20.25 MG/ACT (1.62%) GEL APPLY 2 PUMPS TO THE SKIN ONCE DAILY   vitamin B-12 (CYANOCOBALAMIN) 500 MCG tablet Take 500 mcg by mouth daily.   VITAMIN D PO Take 5,000 Units by mouth 2 (two) times daily.   zinc gluconate 50 MG tablet Take 50 mg by mouth daily.   IBUPROFEN PO Take by mouth. (Patient not taking: Reported on 08/06/2023)   No current facility-administered medications on file prior to visit.     Allergies:  Allergies  Allergen Reactions   Oxycodone Nausea And Vomiting     Medical History:  Past Medical History:  Diagnosis Date   Arthritis    Hyperlipidemia    Hypertension    Hypogonadism male    Pre-diabetes    Rectal adenocarcinoma (HCC) 2009   colostomy   Family history- Reviewed and unchanged Social history- Reviewed and unchanged   Review of Systems:  Review of Systems  Constitutional:   Negative for malaise/fatigue and weight loss.  HENT:  Negative for  hearing loss and tinnitus (Ongoing chronic, bilateral, improved with hearing aids).   Eyes:  Negative for blurred vision and double vision.  Respiratory:  Negative for cough, shortness of breath and wheezing.   Cardiovascular:  Negative for chest pain, palpitations, orthopnea, claudication and leg swelling.  Gastrointestinal:  Negative for abdominal pain, blood in stool, constipation, diarrhea, heartburn, melena, nausea and vomiting.  Genitourinary: Negative.   Musculoskeletal:  Positive for back pain (midline with left sided sciatica). Negative for myalgias.  Skin:  Negative for rash.  Neurological:  Negative for dizziness, tingling, sensory change, weakness and headaches.  Endo/Heme/Allergies:  Negative for polydipsia.  Psychiatric/Behavioral: Negative.    All other systems reviewed and are negative.   Physical Exam: BP 138/88   Pulse 69   Temp (!) 97.5 F (36.4 C)   Ht 5\' 8"  (1.727 m)   Wt 219 lb 6.4 oz (99.5 kg)   SpO2 98%   BMI 33.36 kg/m  Wt Readings from Last 3 Encounters:  08/06/23 219 lb 6.4 oz (99.5 kg)  04/30/23 214 lb 3.2 oz (97.2 kg)  12/10/22 216 lb 6.4 oz (98.2 kg)   General Appearance: Well nourished, in no apparent distress. Eyes: PERRLA, EOMs, conjunctiva no swelling or erythema Sinuses: No Frontal/maxillary tenderness ENT/Mouth: Ext aud canals clear, TMs without erythema, bulging. No erythema, swelling, or exudate on post pharynx.  Hard of hearing- with bilateral hearing aids.  Neck: Supple, thyroid normal.  Respiratory: Respiratory effort normal, BS equal bilaterally without rales, rhonchi, wheezing or stridor.  Cardio: RRR with no MRGs. Brisk peripheral pulses without edema.  Abdomen: Soft, + BS.  Non tender, no guarding, rebound, masses. Colostomy bag present to left without ulcers, skin breakdown, purulent discharge/mucus.  Lymphatics: Non tender without lymphadenopathy.  Musculoskeletal:  Full ROM, 5/5 strength, Normal gait. Unable to reproduce shoulder pain with exam Skin: Warm, dry without rashes, ecchymosis.  Neuro: Cranial nerves intact. No cerebellar symptoms.  Psych: Awake and oriented X 3, normal affect, Insight and Judgment appropriate.   Raynelle Dick, NP 10:06 AM [facility]

## 2023-08-06 ENCOUNTER — Ambulatory Visit: Payer: BC Managed Care – PPO | Admitting: Nurse Practitioner

## 2023-08-06 ENCOUNTER — Encounter: Payer: Self-pay | Admitting: Nurse Practitioner

## 2023-08-06 VITALS — BP 138/88 | HR 69 | Temp 97.5°F | Ht 68.0 in | Wt 219.4 lb

## 2023-08-06 DIAGNOSIS — I10 Essential (primary) hypertension: Secondary | ICD-10-CM | POA: Diagnosis not present

## 2023-08-06 DIAGNOSIS — Z933 Colostomy status: Secondary | ICD-10-CM

## 2023-08-06 DIAGNOSIS — E349 Endocrine disorder, unspecified: Secondary | ICD-10-CM

## 2023-08-06 DIAGNOSIS — R7309 Other abnormal glucose: Secondary | ICD-10-CM

## 2023-08-06 DIAGNOSIS — E66811 Obesity, class 1: Secondary | ICD-10-CM

## 2023-08-06 DIAGNOSIS — E782 Mixed hyperlipidemia: Secondary | ICD-10-CM

## 2023-08-06 DIAGNOSIS — Z79899 Other long term (current) drug therapy: Secondary | ICD-10-CM

## 2023-08-06 DIAGNOSIS — E559 Vitamin D deficiency, unspecified: Secondary | ICD-10-CM

## 2023-08-06 DIAGNOSIS — M1000 Idiopathic gout, unspecified site: Secondary | ICD-10-CM

## 2023-08-06 DIAGNOSIS — Z85048 Personal history of other malignant neoplasm of rectum, rectosigmoid junction, and anus: Secondary | ICD-10-CM

## 2023-08-06 NOTE — Patient Instructions (Signed)

## 2023-08-07 LAB — COMPLETE METABOLIC PANEL WITH GFR
AG Ratio: 2.1 (calc) (ref 1.0–2.5)
ALT: 23 U/L (ref 9–46)
AST: 13 U/L (ref 10–35)
Albumin: 4.7 g/dL (ref 3.6–5.1)
Alkaline phosphatase (APISO): 42 U/L (ref 35–144)
BUN: 18 mg/dL (ref 7–25)
CO2: 29 mmol/L (ref 20–32)
Calcium: 9.8 mg/dL (ref 8.6–10.3)
Chloride: 103 mmol/L (ref 98–110)
Creat: 0.88 mg/dL (ref 0.70–1.35)
Globulin: 2.2 g/dL (ref 1.9–3.7)
Glucose, Bld: 88 mg/dL (ref 65–99)
Potassium: 4.6 mmol/L (ref 3.5–5.3)
Sodium: 141 mmol/L (ref 135–146)
Total Bilirubin: 1.1 mg/dL (ref 0.2–1.2)
Total Protein: 6.9 g/dL (ref 6.1–8.1)
eGFR: 96 mL/min/{1.73_m2} (ref >=60)

## 2023-08-07 LAB — LIPID PANEL
Cholesterol: 163 mg/dL (ref <200)
HDL: 53 mg/dL (ref >=40)
LDL Cholesterol (Calc): 89 mg/dL
Non-HDL Cholesterol (Calc): 110 mg/dL (ref <130)
Total CHOL/HDL Ratio: 3.1 (calc) (ref <5.0)
Triglycerides: 116 mg/dL (ref <150)

## 2023-08-07 LAB — MAGNESIUM: Magnesium: 2.2 mg/dL (ref 1.5–2.5)

## 2023-08-07 LAB — CBC WITH DIFFERENTIAL/PLATELET
Absolute Lymphocytes: 1630 {cells}/uL (ref 850–3900)
Absolute Monocytes: 405 {cells}/uL (ref 200–950)
Basophils Absolute: 29 {cells}/uL (ref 0–200)
Basophils Relative: 0.5 %
Eosinophils Absolute: 97 {cells}/uL (ref 15–500)
Eosinophils Relative: 1.7 %
HCT: 47.1 % (ref 38.5–50.0)
Hemoglobin: 15.5 g/dL (ref 13.2–17.1)
MCH: 28.4 pg (ref 27.0–33.0)
MCHC: 32.9 g/dL (ref 32.0–36.0)
MCV: 86.4 fL (ref 80.0–100.0)
MPV: 11.8 fL (ref 7.5–12.5)
Monocytes Relative: 7.1 %
Neutro Abs: 3540 {cells}/uL (ref 1500–7800)
Neutrophils Relative %: 62.1 %
Platelets: 172 10*3/uL (ref 140–400)
RBC: 5.45 10*6/uL (ref 4.20–5.80)
RDW: 13.1 % (ref 11.0–15.0)
Total Lymphocyte: 28.6 %
WBC: 5.7 10*3/uL (ref 3.8–10.8)

## 2023-08-07 LAB — TESTOSTERONE: Testosterone: 147 ng/dL — ABNORMAL LOW (ref 250–827)

## 2023-09-24 ENCOUNTER — Encounter: Payer: Self-pay | Admitting: Pediatrics

## 2023-10-05 ENCOUNTER — Ambulatory Visit (AMBULATORY_SURGERY_CENTER): Payer: BC Managed Care – PPO

## 2023-10-05 VITALS — Ht 68.0 in | Wt 215.0 lb

## 2023-10-05 DIAGNOSIS — Z85048 Personal history of other malignant neoplasm of rectum, rectosigmoid junction, and anus: Secondary | ICD-10-CM

## 2023-10-05 MED ORDER — SUFLAVE 178.7 G PO SOLR: 1.0000 | ORAL | 0 refills | Status: DC

## 2023-10-05 NOTE — Progress Notes (Signed)
No egg or soy allergy known to patient  No issues known to pt with past sedation with any surgeries or procedures Patient denies ever being told they had issues or difficulty with intubation  No FH of Malignant Hyperthermia Pt is not on diet pills Pt is not on  home 02  Hx OSA using mouth Guard  Pt is not on blood thinners  Pt denies issues with constipation  No A fib or A flutter Have any cardiac testing pending--no  LOA: independent  Prep: suflave   Patient's chart reviewed by Cathlyn Parsons CNRA prior to previsit and patient appropriate for the [facility].  Previsit completed and red dot placed by patient's name on their procedure day (on provider's schedule).     PV completed with patient. Prep instructions sent via mychart and home address.

## 2023-10-26 ENCOUNTER — Encounter: Payer: BC Managed Care – PPO | Admitting: Pediatrics

## 2023-11-06 ENCOUNTER — Encounter: Payer: Self-pay | Admitting: Pediatrics

## 2023-11-12 ENCOUNTER — Ambulatory Visit: Payer: BC Managed Care – PPO | Admitting: Internal Medicine

## 2023-11-12 NOTE — Progress Notes (Unsigned)
  [facility] History and Physical   Primary Care Physician:  Juan Cowboy, MD   Reason for Procedure:  Surveillance for history of rectal cancer status post APR 2009  Plan:    Surveillance colonoscopy  HPI: Juan Higgins is a 63 y.o. male undergoing surveillance colonoscopy for a personal history of rectal cancer.  Juan Higgins was diagnosed with rectal cancer in 2009 and underwent APR with permanent colostomy at [facility].  He does not have a rectal pouch.  Last colonoscopy was performed in 2018 via colostomy by Juan. Christella Higgins and was normal without polyps.  A 5-year follow-up was recommended.  Previous history also documents endoscopic removal of a large villous adenoma in 2006.  No documented family history of colorectal cancer   Past Medical History:  Diagnosis Date   Arthritis    Hyperlipidemia    Hypertension    Hypogonadism male    Pre-diabetes    Rectal adenocarcinoma (HCC) 2009   colostomy    Past Surgical History:  Procedure Laterality Date   ABDOMINOPERINEAL PROCTOCOLECTOMY N/A 07/18/2008   Juan Higgins, Juan Higgins.  Very distal rectal cancer   colonoscopsy  09/2011   PARASTOMAL HERNIA REPAIR  2010   Juan Higgins,  Juan Higgins   RECONSTRUCT / STABILIZE DISTAL ULNA     WISDOM TOOTH EXTRACTION      Prior to Admission medications   Medication Sig Start Date End Date Taking? Authorizing Provider  Acetaminophen (TYLENOL ARTHRITIS PAIN PO) Take by mouth.    [provider]  aspirin 81 MG tablet Take 81 mg by mouth daily.    [provider]  Ibuprofen (ADVIL PO) Take by mouth as needed.    [provider]  IRON PO Take 500 mg by mouth every other day.    [provider]  LOTEMAX 0.5 % GEL as needed. 10/03/16   [provider]  meloxicam (MOBIC) 15 MG tablet Take 1/2 to 1 tablet Daily with Food for Pain & Inflammation Patient taking differently: as needed. Take 1/2 to 1 tablet Daily with Food for Pain & Inflammation  09/06/21   Juan Dick, NP  PEG 3350-KCl-NaCl-NaSulf-MgSul (SUFLAVE) 178.7 g SOLR Take 1 kit by mouth as directed. 10/05/23   Juan Glazier, MD  rosuvastatin (CRESTOR) 20 MG tablet TAKE ONE TABLET BY MOUTH DAILY FOR CHOLESTEROL 12/01/22   Juan Dick, NP  tadalafil (CIALIS) 20 MG tablet Take  1/2 to 1 tablet  every 2 to 3 days  as needed  for XXXX                                                                        /                                             TAKE                                     BY  MOUTH 10/30/22   Juan Cowboy, MD  Testosterone 20.25 MG/ACT (1.62%) GEL APPLY 2 PUMPS TO THE SKIN ONCE DAILY 10/30/22   Juan Dick, NP  vitamin B-12 (CYANOCOBALAMIN) 500 MCG tablet Take 500 mcg by mouth daily.    [provider]  VITAMIN D PO Take 5,000 Units by mouth 2 (two) times daily.    [provider]  zinc gluconate 50 MG tablet Take 50 mg by mouth daily.    [provider]    Current Outpatient Medications  Medication Sig Dispense Refill   Acetaminophen (TYLENOL ARTHRITIS PAIN PO) Take by mouth.     aspirin 81 MG tablet Take 81 mg by mouth daily.     Ibuprofen (ADVIL PO) Take by mouth as needed.     IRON PO Take 500 mg by mouth every other day.     LOTEMAX 0.5 % GEL as needed.     meloxicam (MOBIC) 15 MG tablet Take 1/2 to 1 tablet Daily with Food for Pain & Inflammation (Patient taking differently: as needed. Take 1/2 to 1 tablet Daily with Food for Pain & Inflammation) 30 tablet 0   PEG 3350-KCl-NaCl-NaSulf-MgSul (SUFLAVE) 178.7 g SOLR Take 1 kit by mouth as directed. 1 each 0   rosuvastatin (CRESTOR) 20 MG tablet TAKE ONE TABLET BY MOUTH DAILY FOR CHOLESTEROL 90 tablet 3   tadalafil (CIALIS) 20 MG tablet Take  1/2 to 1 tablet  every 2 to 3 days  as needed  for XXXX                                                                        /                                             TAKE                                      BY                                 MOUTH 30 tablet 3   Testosterone 20.25 MG/ACT (1.62%) GEL APPLY 2 PUMPS TO THE SKIN ONCE DAILY 225 g 0   vitamin B-12 (CYANOCOBALAMIN) 500 MCG tablet Take 500 mcg by mouth daily.     VITAMIN D PO Take 5,000 Units by mouth 2 (two) times daily.     zinc gluconate 50 MG tablet Take 50 mg by mouth daily.     No current facility-administered medications for this visit.    Allergies as of 11/13/2023 - Review Complete 10/05/2023  Allergen Reaction Noted   Oxycodone Nausea And Vomiting 10/01/2016    Family History  Problem Relation Age of Onset   COPD Mother    Alcohol abuse Father    Cirrhosis Father    COPD Sister    Alcohol abuse Brother    Diabetes Maternal Grandmother    Colon cancer Neg Hx    Rectal cancer Neg  Hx    Stomach cancer Neg Hx     Social History   Socioeconomic History   Marital status: Married    Spouse name: Not on file   Number of children: Not on file   Years of education: Not on file   Highest education level: Not on file  Occupational History   Not on file  Tobacco Use   Smoking status: Never   Smokeless tobacco: Never  Substance and Sexual Activity   Alcohol use: Yes    Alcohol/week: 4.0 standard drinks of alcohol    Types: 4 Glasses of wine per week    Comment: 4   Drug use: No   Sexual activity: Not on file  Other Topics Concern   Not on file  Social History Narrative   Not on file   Social Drivers of Health   Financial Resource Strain: Not on file  Food Insecurity: Not on file  Transportation Needs: Not on file  Physical Activity: Not on file  Stress: Not on file  Social Connections: Not on file  Intimate Partner Violence: Not on file    Review of Systems:  All other review of systems negative except as mentioned in the HPI.  Physical Exam: Vital signs There were no vitals taken for this visit.  General:   Alert,  Well-developed, well-nourished, pleasant and cooperative in  NAD Airway:  Mallampati  Lungs:  Clear throughout to auscultation.   Heart:  Regular rate and rhythm; no murmurs, clicks, rubs,  or gallops. Abdomen:  Soft, nontender and nondistended. Normal bowel sounds.   Neuro/Psych:  Normal mood and affect. A and O x 3  Maren Beach, MD [facility]

## 2023-11-13 ENCOUNTER — Encounter: Payer: Self-pay | Admitting: Pediatrics

## 2023-11-13 ENCOUNTER — Ambulatory Visit: Payer: BC Managed Care – PPO | Admitting: Pediatrics

## 2023-11-13 VITALS — BP 139/84 | HR 75 | Temp 97.9°F | Resp 13 | Ht 68.0 in | Wt 215.0 lb

## 2023-11-13 DIAGNOSIS — Z85048 Personal history of other malignant neoplasm of rectum, rectosigmoid junction, and anus: Secondary | ICD-10-CM

## 2023-11-13 DIAGNOSIS — Z1211 Encounter for screening for malignant neoplasm of colon: Secondary | ICD-10-CM

## 2023-11-13 MED ORDER — SODIUM CHLORIDE 0.9 % IV SOLN: 500.0000 mL | Freq: Once | INTRAVENOUS | Status: DC

## 2023-11-13 NOTE — Patient Instructions (Addendum)
 Resume previous diet Continue present medications  There were no colon polyps seen today!  You will need another screening colonoscopy in 5 years, you will receive a letter at that time when you are due for the procedure.   Please call us at [phone] if you have a change in bowel habits, change in family history of colo-rectal cancer, rectal bleeding or other GI concern before that time.  YOU HAD AN ENDOSCOPIC PROCEDURE TODAY AT THE [facility]:   Refer to the procedure report that was given to you for any specific questions about what was found during the examination.  If the procedure report does not answer your questions, please call your gastroenterologist to clarify.  If you requested that your care partner not be given the details of your procedure findings, then the procedure report has been included in a sealed envelope for you to review at your convenience later.  YOU SHOULD EXPECT: Some feelings of bloating in the abdomen. Passage of more gas than usual.  Walking can help get rid of the air that was put into your GI tract during the procedure and reduce the bloating. If you had a lower endoscopy (such as a colonoscopy or flexible sigmoidoscopy) you may notice spotting of blood in your stool or on the toilet paper. If you underwent a bowel prep for your procedure, you may not have a normal bowel movement for a few days.  Please Note:  You might notice some irritation and congestion in your nose or some drainage.  This is from the oxygen used during your procedure.  There is no need for concern and it should clear up in a day or so.  SYMPTOMS TO REPORT IMMEDIATELY: Following lower endoscopy (colonoscopy):  Excessive amounts of blood in the stool  Significant tenderness or worsening of abdominal pains  Swelling of the abdomen that is new, acute  Fever of 100F or higher For urgent or emergent issues, a gastroenterologist can be reached at any hour by calling [phone]  [phone]. Do not use MyChart messaging for urgent concerns.   DIET:  We do recommend a small meal at first, but then you may proceed to your regular diet.  Drink plenty of fluids but you should avoid alcoholic beverages for 24 hours.  ACTIVITY:  You should plan to take it easy for the rest of today and you should NOT DRIVE or use heavy machinery until tomorrow (because of the sedation medicines used during the test).    FOLLOW UP: Our staff will call the number listed on your records the next business day following your procedure.  We will call around 7:15- 8:00 am to check on you and address any questions or concerns that you may have regarding the information given to you following your procedure. If we do not reach you, we will leave a message.     SIGNATURES/CONFIDENTIALITY: You and/or your care partner have signed paperwork which will be entered into your electronic medical record.  These signatures attest to the fact that that the information above on your After Visit Summary has been reviewed and is understood.  Full responsibility of the confidentiality of this discharge information lies with you and/or your care-partner.

## 2023-11-13 NOTE — Progress Notes (Signed)
 Vss nad trans to pacu

## 2023-11-13 NOTE — Progress Notes (Signed)
 Pt's states no medical or surgical changes since previsit or office visit.

## 2023-11-13 NOTE — Op Note (Signed)
 [facility] Patient Name: Juan Higgins Procedure Date: 11/13/2023 8:58 AM MRN: [id number] Endoscopist: Maren Beach , MD, [id number] Age: 63 Referring MD:  Date of Birth: [birth year] Gender: Male Account #: [id number] Procedure:                Colonoscopy Indications:              High risk colon cancer surveillance: Personal                            history of rectal cancer status post APR with                            descending colostomy 2009, Last colonoscopy:                            February 2018 Medicines:                Monitored Anesthesia Care Procedure:                Pre-Anesthesia Assessment:                           - Prior to the procedure, a History and Physical                            was performed, and patient medications and                            allergies were reviewed. The patient's tolerance of                            previous anesthesia was also reviewed. The risks                            and benefits of the procedure and the sedation                            options and risks were discussed with the patient.                            All questions were answered, and informed consent                            was obtained. Prior Anticoagulants: The patient has                            taken no anticoagulant or antiplatelet agents. ASA                            Grade Assessment: II - A patient with mild systemic                            disease. After reviewing the risks and benefits,  the patient was deemed in satisfactory condition to                            undergo the procedure.                           After obtaining informed consent, the colonoscope                            was passed under direct vision. Throughout the                            procedure, the patient's blood pressure, pulse, and                            oxygen saturations were monitored continuously. The                             CF [id number] #[id number] was introduced through the                            descending colostomy and advanced to the the cecum,                            identified by appendiceal orifice and ileocecal                            valve. The colonoscopy was performed without                            difficulty. The patient tolerated the procedure                            well. The quality of the bowel preparation was                            good. The ileocecal valve and the appendiceal                            orifice were photographed. Scope In: 9:12:44 AM Scope Out: 9:25:16 AM Scope Withdrawal Time: 0 hours 8 minutes 44 seconds  Total Procedure Duration: 0 hours 12 minutes 32 seconds  Findings:                 The colon was intubated via the descending colon                            colostomy. The stoma tissue appeared healthy. The                            colon (entire examined portion) appeared normal. No                            polyps were seen. Complications:  No immediate complications. Estimated blood loss:                            None. Estimated Blood Loss:     Estimated blood loss: none. Estimated blood loss:                            none. Impression:               - The entire examined colon is normal.                           - No specimens collected. Recommendation:           - Discharge patient to home (ambulatory).                           - Repeat colonoscopy in 5 years for surveillance.                           - The findings and recommendations were discussed                            with the patient's family.                           - Return to referring physician.                           - Patient has a contact number available for                            emergencies. The signs and symptoms of potential                            delayed complications were discussed with the                            patient.  Return to normal activities tomorrow.                            Written discharge instructions were provided to the                            patient. Maren Beach, MD 11/13/2023 9:33:39 AM This report has been signed electronically.

## 2023-11-16 ENCOUNTER — Telehealth: Payer: Self-pay

## 2023-11-16 NOTE — Telephone Encounter (Signed)
 No answer, left message to call if having any issues or concerns, B.Vale Mousseau RN

## 2024-02-18 ENCOUNTER — Ambulatory Visit: Payer: BC Managed Care – PPO | Admitting: Nurse Practitioner

## 2024-03-29 ENCOUNTER — Encounter: Payer: BC Managed Care – PPO | Admitting: Internal Medicine

## 2024-05-10 ENCOUNTER — Encounter: Payer: BC Managed Care – PPO | Admitting: Internal Medicine

## 2024-05-26 ENCOUNTER — Encounter: Payer: BC Managed Care – PPO | Admitting: Internal Medicine

## 2024-07-10 ENCOUNTER — Encounter: Payer: Self-pay | Admitting: Emergency Medicine

## 2024-07-10 ENCOUNTER — Ambulatory Visit
Admission: EM | Admit: 2024-07-10 | Discharge: 2024-07-10 | Disposition: A | Discharge status: Home / Self Care | Attending: Family Medicine | Admitting: Family Medicine

## 2024-07-10 ENCOUNTER — Ambulatory Visit: Payer: Self-pay

## 2024-07-10 DIAGNOSIS — R051 Acute cough: Secondary | ICD-10-CM

## 2024-07-10 DIAGNOSIS — B309 Viral conjunctivitis, unspecified: Secondary | ICD-10-CM

## 2024-07-10 DIAGNOSIS — J069 Acute upper respiratory infection, unspecified: Secondary | ICD-10-CM

## 2024-07-10 LAB — POC SOFIA SARS ANTIGEN FIA: SARS Coronavirus 2 Ag: NEGATIVE

## 2024-07-10 MED ORDER — BENZONATATE 100 MG PO CAPS: 100.0000 mg | ORAL_CAPSULE | Freq: Three times a day (TID) | ORAL | 0 refills | Status: AC

## 2024-07-10 MED ORDER — AZELASTINE HCL 0.05 % OP SOLN: 1.0000 [drp] | Freq: Two times a day (BID) | OPHTHALMIC | 0 refills | Status: AC | PRN

## 2024-07-10 NOTE — ED Provider Notes (Signed)
 [facility]    CSN: [id number] Arrival date & time: 07/10/24  1158      History   Chief Complaint Chief Complaint  Patient presents with   Cough   Eye Problem    HPI Juan Higgins is a 63 y.o. male  presents for evaluation of URI symptoms for 5 days. Patient reports associated symptoms of cough, can rest and with onset of bilateral eye redness, and watery drainage, and itching. Denies N/V/D, fevers, sore throat, bodies, shortness of breath, visual changes, foreign body sensation, eye injury. Patient does not have a hx of asthma. Patient is his not an active smoker.  Wears glasses only.   Pt has taken cough medicine OTC for symptoms. Pt has no other concerns at this time.    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  Very distal rectal cancer   colonoscopsy  09/2011   COLOSTOMY     PARASTOMAL HERNIA REPAIR  2010   Dr Cathlyn Darral,  [facility]   RECONSTRUCT / STABILIZE DISTAL ULNA     WISDOM TOOTH EXTRACTION         Home Medications    Prior to Admission medications   Medication Sig Start Date End Date Taking? Authorizing  Provider  azelastine (OPTIVAR) 0.05 % ophthalmic solution Place 1 drop into both eyes 2 (two) times daily as needed (eye redness/itching). 07/10/24  Yes Evian Salguero, Jodi R, NP  benzonatate  (TESSALON ) 100 MG capsule Take 1 capsule (100 mg total) by mouth every 8 (eight) hours. 07/10/24  Yes Frona Yost, Jodi R, NP  Acetaminophen (TYLENOL ARTHRITIS PAIN PO) Take by mouth.    [provider]  aspirin 81 MG tablet Take 81 mg by mouth daily.    [provider]  Ibuprofen (ADVIL PO) Take by mouth as needed.    [provider]  IRON PO Take 500 mg by mouth every other day.    [provider]  LOTEMAX 0.5 % GEL as needed. 10/03/16   [provider]  meloxicam  (MOBIC ) 15 MG tablet Take 1/2 to 1 tablet Daily with Food for Pain & Inflammation Patient taking differently: as needed. Take 1/2 to 1 tablet Daily with Food for Pain & Inflammation 09/06/21   Wilkinson, Dana E, NP  rosuvastatin  (CRESTOR ) 20 MG tablet TAKE ONE TABLET BY MOUTH DAILY FOR CHOLESTEROL 12/01/22   Wilkinson, Dana E, NP  tadalafil  (CIALIS ) 20 MG tablet Take  1/2 to 1 tablet  every 2 to 3 days  as needed  for XXXX                                                                        /  TAKE                                     BY                                 MOUTH 10/30/22   Tonita Fallow, MD  Testosterone  20.25 MG/ACT (1.62%) GEL APPLY 2 PUMPS TO THE SKIN ONCE DAILY Patient not taking: Reported on 11/13/2023 10/30/22   Wilkinson, Dana E, NP  vitamin B-12 (CYANOCOBALAMIN ) 500 MCG tablet Take 500 mcg by mouth daily.    [provider]  VITAMIN D  PO Take 5,000 Units by mouth 2 (two) times daily.    [provider]  zinc gluconate 50 MG tablet Take 50 mg by mouth daily.    [provider]    Family History Family History  Problem Relation Age of Onset   COPD Mother    Alcohol abuse Father    Cirrhosis Father    COPD Sister    Alcohol abuse  Brother    Diabetes Maternal Grandmother    Colon cancer Neg Hx    Rectal cancer Neg Hx    Stomach cancer Neg Hx     Social History Social History   Tobacco Use   Smoking status: Never   Smokeless tobacco: Never  Substance Use Topics   Alcohol use: Yes    Alcohol/week: 4.0 standard drinks of alcohol    Types: 4 Glasses of wine per week    Comment: 4   Drug use: No     Allergies   Oxycodone   Review of Systems Review of Systems  HENT:  Positive for congestion.   Eyes:  Positive for discharge, redness and itching.  Respiratory:  Positive for cough.      Physical Exam Triage Vital Signs ED Triage Vitals  Encounter Vitals Group     BP 07/10/24 1215 135/88     Girls Systolic BP Percentile --      Girls Diastolic BP Percentile --      Boys Systolic BP Percentile --      Boys Diastolic BP Percentile --      Pulse Rate 07/10/24 1215 92     Resp 07/10/24 1215 17     Temp 07/10/24 1215 99 F (37.2 C)     Temp Source 07/10/24 1215 Oral     SpO2 --      Weight --      Height --      Head Circumference --      Peak Flow --      Pain Score 07/10/24 1213 0     Pain Loc --      Pain Education --      Exclude from Growth Chart --    No data found.  Updated Vital Signs BP 135/88 (BP Location: Right Arm)   Pulse 92   Temp 99 F (37.2 C) (Oral)   Resp 17   Visual Acuity Right Eye Distance:   Left Eye Distance:   Bilateral Distance:    Right Eye Near:   Left Eye Near:    Bilateral Near:     Physical Exam Vitals and nursing note reviewed.  Constitutional:      General: He is not in acute distress.    Appearance: Normal appearance. He is not ill-appearing or toxic-appearing.  HENT:     Head: Normocephalic and atraumatic.     Right Ear: Tympanic membrane and ear canal normal.     Left Ear: Tympanic membrane and ear canal normal.     Nose: Congestion present.     Mouth/Throat:     Mouth: Mucous membranes are moist.     Pharynx: No oropharyngeal exudate  or posterior oropharyngeal erythema.  Eyes:     General: Lids are normal.        Right eye: Discharge present. No foreign body or hordeolum.        Left eye: Discharge present.No foreign body or hordeolum.     Conjunctiva/sclera:     Right eye: Right conjunctiva is injected. No chemosis, exudate or hemorrhage.    Left eye: Left conjunctiva is injected. No chemosis, exudate or hemorrhage.    Pupils: Pupils are equal, round, and reactive to light.     Comments: Watery drainage noted bilateral eyes  Cardiovascular:     Rate and Rhythm: Normal rate and regular rhythm.     Heart sounds: Normal heart sounds.  Pulmonary:     Effort: Pulmonary effort is normal.     Breath sounds: Normal breath sounds.  Musculoskeletal:     Cervical back: Normal range of motion and neck supple.  Lymphadenopathy:     Cervical: No cervical adenopathy.  Skin:    General: Skin is warm and dry.  Neurological:     General: No focal deficit present.     Mental Status: He is alert and oriented to person, place, and time.  Psychiatric:        Mood and Affect: Mood normal.        Behavior: Behavior normal.      UC Treatments / Results  Labs (all labs ordered are listed, but only abnormal results are displayed) Labs Reviewed  POC SOFIA SARS ANTIGEN FIA    EKG   Radiology No results found.  Procedures Procedures (including critical care time)  Medications Ordered in UC Medications - No data to display  Initial Impression / Assessment and Plan / UC Course  I have reviewed the triage vital signs and the nursing notes.  Pertinent labs & imaging results that were available during my care of the patient were reviewed by me and considered in my medical decision making (see chart for details).     Reviewed exam and symptoms with patient.  Negative COVID-19.  Discussed viral upper respiratory illness with viral conjunctivitis.  Will do trial of antihistamine eyedrops advise continuation of warm  compresses.  Tessalon  as needed for cough.  Encourage lots of rest fluids and PCP follow-up if symptoms do not improve.  ER precautions reviewed. Final Clinical Impressions(s) / UC Diagnoses   Final diagnoses:  Acute cough  Viral conjunctivitis  Viral upper respiratory illness     Discharge Instructions      You tested negative for COVID-19.  Please treat your symptoms with over the counter tylenol or ibuprofen, humidifier, and rest.  You can use an antihistamine eyedrops twice daily as needed and may take Tessalon  3 times a day as needed for your cough.  Viral illnesses can last 7-14 days. Please follow up with your PCP if your symptoms are not improving. Please go to the ER for any worsening symptoms. This includes but is not limited to fever you can not control with tylenol or ibuprofen, you are not able to stay hydrated, you have shortness of breath or chest pain.  Thank  you for choosing [facility] for your healthcare needs. I hope you feel better soon!      ED Prescriptions     Medication Sig Dispense Auth. Provider   azelastine (OPTIVAR) 0.05 % ophthalmic solution Place 1 drop into both eyes 2 (two) times daily as needed (eye redness/itching). 6 mL Ramiya Delahunty, Jodi R, NP   benzonatate  (TESSALON ) 100 MG capsule Take 1 capsule (100 mg total) by mouth every 8 (eight) hours. 21 capsule Annalisia Ingber, Jodi R, NP      PDMP not reviewed this encounter.   Loreda Myla SAUNDERS, NP 07/10/24 1255

## 2024-07-10 NOTE — Discharge Instructions (Addendum)
 You tested negative for COVID-19.  Please treat your symptoms with over the counter tylenol or ibuprofen, humidifier, and rest.  You can use an antihistamine eyedrops twice daily as needed and may take Tessalon  3 times a day as needed for your cough.  Viral illnesses can last 7-14 days. Please follow up with your PCP if your symptoms are not improving. Please go to the ER for any worsening symptoms. This includes but is not limited to fever you can not control with tylenol or ibuprofen, you are not able to stay hydrated, you have shortness of breath or chest pain.  Thank you for choosing [facility] for your healthcare needs. I hope you feel better soon!

## 2024-07-10 NOTE — ED Triage Notes (Signed)
 Pt c/o cough since Tuesday. On Thursday he began to have bilateral eye irritation, redness, and drainage.
